# Patient Record
Sex: Female | Born: 1990
Health system: Southern US, Community
[De-identification: ages and names within clinical notes are randomized; demographics above are authoritative.]

## PROBLEM LIST (undated history)

## (undated) ENCOUNTER — Inpatient Hospital Stay (HOSPITAL_COMMUNITY): Payer: Self-pay

## (undated) DIAGNOSIS — Z789 Other specified health status: Secondary | ICD-10-CM

## (undated) DIAGNOSIS — I10 Essential (primary) hypertension: Secondary | ICD-10-CM

## (undated) HISTORY — PX: NO PAST SURGERIES: SHX2092

---

## 2010-05-06 ENCOUNTER — Ambulatory Visit: Payer: Self-pay | Admitting: Diagnostic Radiology

## 2010-05-06 ENCOUNTER — Emergency Department (HOSPITAL_BASED_OUTPATIENT_CLINIC_OR_DEPARTMENT_OTHER): Admission: EM | Admit: 2010-05-06 | Discharge: 2010-05-06 | Payer: Self-pay | Admitting: Emergency Medicine

## 2011-01-02 LAB — PREGNANCY, URINE: Preg Test, Ur: NEGATIVE

## 2013-08-17 ENCOUNTER — Inpatient Hospital Stay (HOSPITAL_COMMUNITY): Payer: Medicaid Other

## 2013-08-17 ENCOUNTER — Encounter (HOSPITAL_COMMUNITY): Payer: Self-pay | Admitting: *Deleted

## 2013-08-17 ENCOUNTER — Inpatient Hospital Stay (HOSPITAL_COMMUNITY)
Admission: AD | Admit: 2013-08-17 | Discharge: 2013-08-17 | Disposition: A | Discharge status: Home / Self Care | Payer: Medicaid Other | Source: Ambulatory Visit | Attending: Obstetrics & Gynecology | Admitting: Obstetrics & Gynecology

## 2013-08-17 DIAGNOSIS — E860 Dehydration: Secondary | ICD-10-CM

## 2013-08-17 DIAGNOSIS — R109 Unspecified abdominal pain: Secondary | ICD-10-CM | POA: Insufficient documentation

## 2013-08-17 DIAGNOSIS — R1084 Generalized abdominal pain: Secondary | ICD-10-CM

## 2013-08-17 DIAGNOSIS — Z3201 Encounter for pregnancy test, result positive: Secondary | ICD-10-CM

## 2013-08-17 DIAGNOSIS — R030 Elevated blood-pressure reading, without diagnosis of hypertension: Secondary | ICD-10-CM | POA: Insufficient documentation

## 2013-08-17 DIAGNOSIS — O211 Hyperemesis gravidarum with metabolic disturbance: Secondary | ICD-10-CM | POA: Insufficient documentation

## 2013-08-17 DIAGNOSIS — O219 Vomiting of pregnancy, unspecified: Secondary | ICD-10-CM

## 2013-08-17 DIAGNOSIS — R112 Nausea with vomiting, unspecified: Secondary | ICD-10-CM

## 2013-08-17 DIAGNOSIS — Z3490 Encounter for supervision of normal pregnancy, unspecified, unspecified trimester: Secondary | ICD-10-CM

## 2013-08-17 DIAGNOSIS — E669 Obesity, unspecified: Secondary | ICD-10-CM | POA: Insufficient documentation

## 2013-08-17 HISTORY — DX: Other specified health status: Z78.9

## 2013-08-17 LAB — CBC
Hemoglobin: 10.9 g/dL — ABNORMAL LOW (ref 12.0–15.0)
MCH: 28.2 pg (ref 26.0–34.0)
MCHC: 33.5 g/dL (ref 30.0–36.0)
RBC: 3.86 MIL/uL — ABNORMAL LOW (ref 3.87–5.11)
WBC: 9.5 10*3/uL (ref 4.0–10.5)

## 2013-08-17 LAB — URINALYSIS, ROUTINE W REFLEX MICROSCOPIC
Bilirubin Urine: NEGATIVE
Glucose, UA: NEGATIVE mg/dL
Hgb urine dipstick: NEGATIVE
Leukocytes, UA: NEGATIVE
Specific Gravity, Urine: >1.03 — ABNORMAL HIGH (ref 1.005–1.030)
pH: 6.5 (ref 5.0–8.0)

## 2013-08-17 LAB — HCG, QUANTITATIVE, PREGNANCY: hCG, Beta Chain, Quant, S: 8942 m[IU]/mL — ABNORMAL HIGH (ref <5)

## 2013-08-17 LAB — WET PREP, GENITAL
WBC, Wet Prep HPF POC: NONE SEEN
Yeast Wet Prep HPF POC: NONE SEEN

## 2013-08-17 LAB — ABO/RH: ABO/RH(D): O POS

## 2013-08-17 LAB — POCT PREGNANCY, URINE: Preg Test, Ur: POSITIVE — AB

## 2013-08-17 MED ORDER — PROMETHAZINE HCL 12.5 MG PO TABS: 12.5000 mg | ORAL_TABLET | Freq: Four times a day (QID) | ORAL | Status: DC | PRN

## 2013-08-17 MED ORDER — PRENATAL PLUS 27-1 MG PO TABS: 1.0000 | ORAL_TABLET | Freq: Every day | ORAL | Status: DC

## 2013-08-17 NOTE — MAU Note (Signed)
Past week having headaches, abd. Cramps, no cycle this month but I normally have irreg. Cycles. LMp 9/10. Denies bleeding or d/c

## 2013-08-17 NOTE — MAU Provider Note (Signed)
Chief Complaint: Abdominal Pain, Nausea, Headache and Possible Pregnancy   First Provider Initiated Contact with Patient 08/17/13 2208     SUBJECTIVE HPI: Christina Larson is a 22 y.o. G1P0 at [redacted]w[redacted]d by LMP who presents with one-week history of lower abdominal cramping, nausea with occasional vomiting and intermittent headaches. She denies nausea or headache at present, but is having some cramping suprapubic discomfort. Did not eat for past 6 hrs, but hungry. No vaginal bleeding. She was unaware of pregnancy and states menstrual interval is usually 2 months.  ROS: Pertinent items in HPI  Past Medical History  Diagnosis Date  . Medical history non-contributory   Denies hx. BP elevation   OB History  Gravida Para Term Preterm AB SAB TAB Ectopic Multiple Living  1             # Outcome Date GA Lbr Len/2nd Weight Sex Delivery Anes PTL Lv  1 CUR              Past Surgical History  Procedure Laterality Date  . No past surgeries     History   Social History  . Marital Status: Single    Spouse Name: N/A    Number of Children: N/A  . Years of Education: N/A   Occupational History  . Not on file.   Social History Main Topics  . Smoking status: Light Tobacco Smoker  . Smokeless tobacco: Not on file  . Alcohol Use: No  . Drug Use: No  . Sexual Activity: Yes    Birth Control/ Protection: None   Other Topics Concern  . Not on file   Social History Narrative  . No narrative on file   No current facility-administered medications on file prior to encounter.   No current outpatient prescriptions on file prior to encounter.   No Known Allergies    OBJECTIVE Blood pressure 144/88, pulse 91, temperature 98.5 F (36.9 C), resp. rate 20, height 5\' 4"  (1.626 m), weight 283 lb 3.2 oz (128.459 kg), last menstrual period 06/27/2013. GENERAL: Obese female in no acute distress.  HEENT: Normocephalic HEART: normal rate RESP: normal effort ABDOMEN: Soft, non-tender EXTREMITIES:  Nontender, no edema NEURO: Alert and oriented SPECULUM EXAM: NEFG, physiologic discharge, no blood noted, cervix clean BIMANUAL: cervix L/C; uterus unable to size due to body habitus, no adnexal tenderness or masses  LAB RESULTS Results for orders placed during the hospital encounter of 08/17/13 (from the past 24 hour(s))  URINALYSIS, ROUTINE W REFLEX MICROSCOPIC     Status: Abnormal   Collection Time    08/17/13  9:37 PM      Result Value Range   Color, Urine YELLOW  YELLOW   APPearance CLEAR  CLEAR   Specific Gravity, Urine >1.030 (*) 1.005 - 1.030   pH 6.5  5.0 - 8.0   Glucose, UA NEGATIVE  NEGATIVE mg/dL   Hgb urine dipstick NEGATIVE  NEGATIVE   Bilirubin Urine NEGATIVE  NEGATIVE   Ketones, ur 15 (*) NEGATIVE mg/dL   Protein, ur NEGATIVE  NEGATIVE mg/dL   Urobilinogen, UA 0.2  0.0 - 1.0 mg/dL   Nitrite NEGATIVE  NEGATIVE   Leukocytes, UA NEGATIVE  NEGATIVE  POCT PREGNANCY, URINE     Status: Abnormal   Collection Time    08/17/13  9:37 PM      Result Value Range   Preg Test, Ur POSITIVE (*) NEGATIVE  WET PREP, GENITAL     Status: None   Collection Time    08/17/13  10:22 PM      Result Value Range   Yeast Wet Prep HPF POC NONE SEEN  NONE SEEN   Trich, Wet Prep NONE SEEN  NONE SEEN   Clue Cells Wet Prep HPF POC NONE SEEN  NONE SEEN   WBC, Wet Prep HPF POC NONE SEEN  NONE SEEN  CBC     Status: Abnormal   Collection Time    08/17/13 10:29 PM      Result Value Range   WBC 9.5  4.0 - 10.5 K/uL   RBC 3.86 (*) 3.87 - 5.11 MIL/uL   Hemoglobin 10.9 (*) 12.0 - 15.0 g/dL   HCT 08.6 (*) 57.8 - 46.9 %   MCV 84.2  78.0 - 100.0 fL   MCH 28.2  26.0 - 34.0 pg   MCHC 33.5  30.0 - 36.0 g/dL   RDW 62.9  52.8 - 41.3 %   Platelets 450 (*) 150 - 400 K/uL  ABO/RH     Status: None   Collection Time    08/17/13 10:29 PM      Result Value Range   ABO/RH(D) O POS    HCG, QUANTITATIVE, PREGNANCY     Status: Abnormal   Collection Time    08/17/13 10:29 PM      Result Value Range   hCG,  Beta Chain, Quant, S 8942 (*) <5 mIU/mL    IMAGING Pelvic US: GS and YS visualized. Gestational sac consistent with 6 weeks 1 day. No embryo visualized. Small SCH. No adnexal mass.  MAU COURSE GC/CT done Fluids po pushed. Tolerating crackers.  ASSESSMENT 1. Pregnancy   2. Elevated BP   3. Mild dehydration   4. Nausea/vomiting in pregnancy   5. Obesity, unspecified     PLAN Discharge home with pregnancy precautions.     Medication List         prenatal vitamin w/FE, FA 27-1 MG Tabs tablet  Take 1 tablet by mouth daily.     promethazine 12.5 MG tablet  Commonly known as:  PHENERGAN  Take 1 tablet (12.5 mg total) by mouth every 6 (six) hours as needed for nausea.       Follow-up Information   Follow up with [facility] [place]. (See list of prenatal care providers)    Contact information:   [address] [place] [address] [phone]        Danae Orleans, CNM 08/17/2013  10:20 PM

## 2013-08-27 ENCOUNTER — Ambulatory Visit (HOSPITAL_COMMUNITY)
Admission: RE | Admit: 2013-08-27 | Discharge: 2013-08-27 | Disposition: A | Discharge status: Home / Self Care | Payer: Medicaid Other | Source: Ambulatory Visit | Attending: Obstetrics and Gynecology | Admitting: Obstetrics and Gynecology

## 2013-08-27 DIAGNOSIS — O219 Vomiting of pregnancy, unspecified: Secondary | ICD-10-CM

## 2013-08-27 DIAGNOSIS — Z3490 Encounter for supervision of normal pregnancy, unspecified, unspecified trimester: Secondary | ICD-10-CM

## 2013-08-27 DIAGNOSIS — Z3689 Encounter for other specified antenatal screening: Secondary | ICD-10-CM | POA: Insufficient documentation

## 2013-08-27 DIAGNOSIS — O3680X0 Pregnancy with inconclusive fetal viability, not applicable or unspecified: Secondary | ICD-10-CM | POA: Insufficient documentation

## 2013-08-27 NOTE — MAU Provider Note (Signed)
Attestation of Attending Supervision of Advanced Practitioner (CNM/NP): Evaluation and management procedures were performed by the Advanced Practitioner under my supervision and collaboration.  I have reviewed the Advanced Practitioner's note and chart, and I agree with the management and plan.  HARRAWAY-SMITH, Myracle Febres 1:14 PM      

## 2013-09-03 ENCOUNTER — Inpatient Hospital Stay (HOSPITAL_COMMUNITY): Payer: Medicaid Other

## 2013-09-03 ENCOUNTER — Inpatient Hospital Stay (HOSPITAL_COMMUNITY)
Admission: AD | Admit: 2013-09-03 | Discharge: 2013-09-03 | Disposition: A | Discharge status: Home / Self Care | Payer: Medicaid Other | Source: Ambulatory Visit | Attending: Family Medicine | Admitting: Family Medicine

## 2013-09-03 ENCOUNTER — Encounter (HOSPITAL_COMMUNITY): Payer: Self-pay | Admitting: *Deleted

## 2013-09-03 DIAGNOSIS — O209 Hemorrhage in early pregnancy, unspecified: Secondary | ICD-10-CM | POA: Insufficient documentation

## 2013-09-03 DIAGNOSIS — R109 Unspecified abdominal pain: Secondary | ICD-10-CM | POA: Insufficient documentation

## 2013-09-03 LAB — URINALYSIS, ROUTINE W REFLEX MICROSCOPIC
Bilirubin Urine: NEGATIVE
Leukocytes, UA: NEGATIVE
Protein, ur: NEGATIVE mg/dL
Specific Gravity, Urine: 1.02 (ref 1.005–1.030)
pH: 8.5 — ABNORMAL HIGH (ref 5.0–8.0)

## 2013-09-03 NOTE — MAU Note (Signed)
Pt presents with complaints of bright red vaginal spotting since last night. Says that she has some lower abdominal cramping also.

## 2013-09-03 NOTE — MAU Provider Note (Signed)
Chief Complaint  Patient presents with  . Vaginal Bleeding    Subjective Christina Larson 22 y.o.  G1P0 at [redacted]w[redacted]d by LMP presents with onset last night of first episode of small amount pink vaginal bleeding. Did not see any blood today. Has menstrual-like crampy lower abdominal pain. Last intercourse weeks ago. Denies abnormal vaginal discharge or irritation. No dysuria or hematuria.  Seen here 08/17/13 for cramps and had US showing IUGS and YS; GC/CT and WP neg. Blood type: O pos Plans PNC at [facility] when [facility] card comes  Problem list, past medical history, Ob/Gyn history, surgical history, family history and social history reviewed and updated as appropriate. Pertinent Medical History: none Pertinent Ob/Gyn History: none Pertinent Surgical History: none Prescriptions prior to admission  Medication Sig Dispense Refill  . prenatal vitamin w/FE, FA (PRENATAL 1 + 1) 27-1 MG TABS tablet Take 1 tablet by mouth daily.  30 each  0  . promethazine (PHENERGAN) 12.5 MG tablet Take 1 tablet (12.5 mg total) by mouth every 6 (six) hours as needed for nausea.  30 tablet  0    No Known Allergies   Objective   Filed Vitals:   09/03/13 1508  BP: 144/78  Pulse: 94  Temp: 98.5 F (36.9 C)  Resp: 18     Physical Exam General: WN/WD in NAD  Abdom: soft, NT External genitalia: normal; BUS neg  SSE: no blood; cervix with no lesions, appears closed Bimanual: Cervix closed, long; uterus NT, ULNS ; adnexa nontender   Lab Results Results for orders placed during the hospital encounter of 09/03/13 (from the past 24 hour(s))  URINALYSIS, ROUTINE W REFLEX MICROSCOPIC     Status: Abnormal   Collection Time    09/03/13  2:50 PM      Result Value Range   Color, Urine YELLOW  YELLOW   APPearance CLOUDY (*) CLEAR   Specific Gravity, Urine 1.020  1.005 - 1.030   pH 8.5 (*) 5.0 - 8.0   Glucose, UA NEGATIVE  NEGATIVE mg/dL   Hgb urine dipstick NEGATIVE  NEGATIVE   Bilirubin Urine NEGATIVE  NEGATIVE    Ketones, ur NEGATIVE  NEGATIVE mg/dL   Protein, ur NEGATIVE  NEGATIVE mg/dL   Urobilinogen, UA 0.2  0.0 - 1.0 mg/dL   Nitrite NEGATIVE  NEGATIVE   Leukocytes, UA NEGATIVE  NEGATIVE    Ultrasound No results found. Bedside US: difficult due to body habitus Assessment 1. Bleeding in early pregnancy   G1 at [redacted]w[redacted]d IUP confirmed at earlier visit   Plan    Discharge home with reassurance Outpt scan for viability See AVS for pt education   Medication List         prenatal multivitamin Tabs tablet  Take 1 tablet by mouth daily at 12 noon.         Follow-up Information   Please follow up. (F/U with [facility])        Sarafina Puthoff 09/03/2013 3:20 PM

## 2013-09-03 NOTE — MAU Provider Note (Signed)
Chart reviewed and agree with management and plan.  

## 2013-09-27 ENCOUNTER — Inpatient Hospital Stay (HOSPITAL_COMMUNITY)
Admission: AD | Admit: 2013-09-27 | Discharge: 2013-09-27 | Disposition: A | Discharge status: Home / Self Care | Payer: BC Managed Care – PPO | Source: Ambulatory Visit | Attending: Obstetrics & Gynecology | Admitting: Obstetrics & Gynecology

## 2013-09-27 ENCOUNTER — Encounter (HOSPITAL_COMMUNITY): Payer: Self-pay | Admitting: *Deleted

## 2013-09-27 DIAGNOSIS — O99891 Other specified diseases and conditions complicating pregnancy: Secondary | ICD-10-CM | POA: Insufficient documentation

## 2013-09-27 DIAGNOSIS — IMO0002 Reserved for concepts with insufficient information to code with codable children: Secondary | ICD-10-CM | POA: Insufficient documentation

## 2013-09-27 DIAGNOSIS — Y9229 Other specified public building as the place of occurrence of the external cause: Secondary | ICD-10-CM | POA: Insufficient documentation

## 2013-09-27 DIAGNOSIS — S3991XA Unspecified injury of abdomen, initial encounter: Secondary | ICD-10-CM

## 2013-09-27 DIAGNOSIS — R1032 Left lower quadrant pain: Secondary | ICD-10-CM | POA: Insufficient documentation

## 2013-09-27 DIAGNOSIS — S3981XA Other specified injuries of abdomen, initial encounter: Secondary | ICD-10-CM

## 2013-09-27 LAB — URINALYSIS, ROUTINE W REFLEX MICROSCOPIC
Bilirubin Urine: NEGATIVE
Hgb urine dipstick: NEGATIVE
Protein, ur: NEGATIVE mg/dL
Urobilinogen, UA: 0.2 mg/dL (ref 0.0–1.0)

## 2013-09-27 NOTE — MAU Provider Note (Signed)
History     CSN: [id number]  Arrival date and time: 09/27/13 2138   First Provider Initiated Contact with Patient 09/27/13 2210      Chief Complaint  Patient presents with  . Abdominal Pain   HPI This is a 22 y.o. female at [redacted]w[redacted]d who presents after getting hit in the  Left Lower quadrant with a door handle today.  States LLQ is tender, worried about baby. No leaking or bleeding  RN Note: Patient presents to MAU with c/o sharp abdominal cramping after having gotten hit in the abdomen by a door at school today.      OB History   Grav Para Term Preterm Abortions TAB SAB Ect Mult Living   1               Past Medical History  Diagnosis Date  . Medical history non-contributory     Past Surgical History  Procedure Laterality Date  . No past surgeries      Family History  Problem Relation Age of Onset  . Alcohol abuse Neg Hx   . Arthritis Neg Hx   . Birth defects Neg Hx   . Asthma Neg Hx   . Cancer Neg Hx   . COPD Neg Hx   . Depression Neg Hx   . Diabetes Neg Hx   . Drug abuse Neg Hx   . Early death Neg Hx   . Hearing loss Neg Hx   . Heart disease Neg Hx   . Hyperlipidemia Neg Hx   . Hypertension Neg Hx   . Kidney disease Neg Hx   . Learning disabilities Neg Hx   . Mental illness Neg Hx   . Mental retardation Neg Hx   . Miscarriages / Stillbirths Neg Hx   . Stroke Neg Hx   . Vision loss Neg Hx   . Varicose Veins Neg Hx     History  Substance Use Topics  . Smoking status: Light Tobacco Smoker  . Smokeless tobacco: Not on file  . Alcohol Use: No    Allergies: No Known Allergies  Prescriptions prior to admission  Medication Sig Dispense Refill  . Prenatal Vit-Fe Fumarate-FA (PRENATAL MULTIVITAMIN) TABS tablet Take 1 tablet by mouth daily at 12 noon.        Review of Systems  Constitutional: Negative for fever, chills and malaise/fatigue.  Gastrointestinal: Positive for nausea and abdominal pain (tender over LLQ superficially). Negative for  vomiting, diarrhea and constipation.  Genitourinary: Negative for dysuria.  Neurological: Negative for dizziness, focal weakness, weakness and headaches.   Physical Exam   Blood pressure 129/76, pulse 87, temperature 98.4 F (36.9 C), temperature source Oral, resp. rate 18, height 5\' 4"  (1.626 m), weight 289 lb 6.4 oz (131.271 kg), last menstrual period 06/27/2013, SpO2 100.00%.  Physical Exam  Constitutional: She is oriented to person, place, and time. She appears well-developed and well-nourished. No distress.  Cardiovascular: Normal rate.   Respiratory: Effort normal.  GI: Soft. She exhibits no distension and no mass. There is tenderness (superficial tenderness over LLQ, no suprapubic tenderness). There is no rebound and no guarding.  Genitourinary: No vaginal discharge found.  Fetal heart tones audible 150 over suprapubic area   Musculoskeletal: Normal range of motion.  Neurological: She is alert and oriented to person, place, and time.  Skin: Skin is warm and dry.  Psychiatric: She has a normal mood and affect.    MAU Course  Procedures  Assessment and Plan  A:  SIUP  at [redacted]w[redacted]d , confirmed dates by 7 wk [place]      Blunt abdominal trauma by door      + fetal heart tones   P:  Discussed protection of baby by uterus and pelvis at this stage      Reassured by [facility]      Followup with prenatal care at [facility] 09/27/2013, 10:32 PM

## 2013-09-27 NOTE — MAU Note (Signed)
Patient presents to MAU with c/o sharp abdominal cramping after having gotten hit in the abdomen by a door at school today.

## 2013-10-05 NOTE — MAU Provider Note (Signed)
Attestation of Attending Supervision of Advanced Practitioner (CNM/NP): Evaluation and management procedures were performed by the Advanced Practitioner under my supervision and collaboration. I have reviewed the Advanced Practitioner's note and chart, and I agree with the management and plan.  Kadence Mikkelson H. 10:28 AM   

## 2013-12-31 ENCOUNTER — Encounter (HOSPITAL_COMMUNITY): Payer: Self-pay | Admitting: *Deleted

## 2013-12-31 ENCOUNTER — Inpatient Hospital Stay (HOSPITAL_COMMUNITY)
Admission: AD | Admit: 2013-12-31 | Discharge: 2013-12-31 | Disposition: A | Discharge status: Home / Self Care | Payer: BC Managed Care – PPO | Source: Ambulatory Visit | Attending: Obstetrics & Gynecology | Admitting: Obstetrics & Gynecology

## 2013-12-31 DIAGNOSIS — O99330 Smoking (tobacco) complicating pregnancy, unspecified trimester: Secondary | ICD-10-CM | POA: Insufficient documentation

## 2013-12-31 DIAGNOSIS — O212 Late vomiting of pregnancy: Secondary | ICD-10-CM | POA: Insufficient documentation

## 2013-12-31 DIAGNOSIS — R109 Unspecified abdominal pain: Secondary | ICD-10-CM | POA: Insufficient documentation

## 2013-12-31 DIAGNOSIS — O219 Vomiting of pregnancy, unspecified: Secondary | ICD-10-CM

## 2013-12-31 LAB — URINALYSIS, ROUTINE W REFLEX MICROSCOPIC
Bilirubin Urine: NEGATIVE
Glucose, UA: NEGATIVE mg/dL
HGB URINE DIPSTICK: NEGATIVE
KETONES UR: 15 mg/dL — AB
LEUKOCYTES UA: NEGATIVE
Nitrite: NEGATIVE
PH: 7 (ref 5.0–8.0)
PROTEIN: NEGATIVE mg/dL
SPECIFIC GRAVITY, URINE: 1.015 (ref 1.005–1.030)
UROBILINOGEN UA: 0.2 mg/dL (ref 0.0–1.0)

## 2013-12-31 MED ORDER — POLYETHYLENE GLYCOL 3350 17 GM/SCOOP PO POWD: 17.0000 g | Freq: Every day | ORAL | Status: DC

## 2013-12-31 MED ORDER — PROMETHAZINE HCL 25 MG/ML IJ SOLN
25.0000 mg | Freq: Once | INTRAVENOUS | Status: AC
  Administered 2013-12-31: 25 mg via INTRAVENOUS
  Filled 2013-12-31: qty 1

## 2013-12-31 MED ORDER — ONDANSETRON 4 MG PO TBDP
4.0000 mg | ORAL_TABLET | Freq: Once | ORAL | Status: AC
  Administered 2013-12-31: 4 mg via ORAL
  Filled 2013-12-31: qty 1

## 2013-12-31 MED ORDER — PROMETHAZINE HCL 12.5 MG PO TABS: 12.5000 mg | ORAL_TABLET | Freq: Four times a day (QID) | ORAL | Status: DC | PRN

## 2013-12-31 NOTE — MAU Note (Signed)
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 States she has had nausea and vomiting over the past weekend. States she has had 2 loose stools today, not diarrhea. Has been having lower abdominal cramping and fatigue. Reports feeling movement but not as much as usual.

## 2013-12-31 NOTE — MAU Provider Note (Signed)
Chief Complaint:  Nausea, Emesis, Fatigue and Abdominal Pain   First Provider Initiated Contact with Patient 12/31/13 1943      HPI: Christina Larson is a 23 y.o. G1P0 at [redacted]w[redacted]d who presents to maternity admissions reporting nausea, vomiting and loose stools for 4 days. Nausea now associated with fatigue, weakness and dizziness. Denies contractions but feels lower abdominal cramping. Denies leakage of fluid or vaginal bleeding. Fetal movement is less than usual.   Pregnancy Course: PNC at [place] in [facility] complicated by obesity. Student in [place] and left school to come here.   Past Medical History: Past Medical History  Diagnosis Date  . Medical history non-contributory     Past obstetric history: OB History  Gravida Para Term Preterm AB SAB TAB Ectopic Multiple Living  1             # Outcome Date GA Lbr Len/2nd Weight Sex Delivery Anes PTL Lv  1 CUR               Past Surgical History: Past Surgical History  Procedure Laterality Date  . No past surgeries       Family History: [place]   Social History: History  Substance Use Topics  . Smoking status: Light Tobacco Smoker  . Smokeless tobacco: Not on file  . Alcohol Use: No    Allergies: No Known Allergies  Meds:  Prescriptions prior to admission  Medication Sig Dispense Refill  . acetaminophen (TYLENOL) 500 MG tablet Take 1,000 mg by mouth every 6 (six) hours as needed for headache.      . Prenatal Vit-Fe Fumarate-FA (PRENATAL MULTIVITAMIN) TABS tablet Take 1 tablet by mouth daily at 12 noon.        ROS: Pertinent findings in history of present illness.  Physical Exam  Blood pressure 132/84, pulse 92, temperature 98 F (36.7 C), temperature source Oral, resp. rate 18, height 5\' 5"  (1.651 m), weight 137.984 kg (304 lb 3.2 oz), last menstrual period 06/27/2013, SpO2 99.00%. GENERAL: Well-developed, well-nourished female in no acute distress.  HEENT: normocephalic HEART: normal rate RESP: normal  effort ABDOMEN: Soft, non-tender, gravid appropriate for gestational age EXTREMITIES: Nontender, no edema NEURO: alert and oriented  Dilation: Closed Effacement (%): Thick Cervical Position: Posterior Exam by:: Dr. Leslie Andrea Dilation: Closed Effacement (%): Thick Cervical Position: Posterior Exam by:: Dr. Leslie Andrea  FHT:  Baseline 145, moderate variability, accelerations present, no decelerations Contractions: none   Labs: Results for orders placed during the hospital encounter of 12/31/13 (from the past 24 hour(s))  URINALYSIS, ROUTINE W REFLEX MICROSCOPIC     Status: Abnormal   Collection Time    12/31/13  6:55 PM      Result Value Ref Range   Color, Urine YELLOW  YELLOW   APPearance CLEAR  CLEAR   Specific Gravity, Urine 1.015  1.005 - 1.030   pH 7.0  5.0 - 8.0   Glucose, UA NEGATIVE  NEGATIVE mg/dL   Hgb urine dipstick NEGATIVE  NEGATIVE   Bilirubin Urine NEGATIVE  NEGATIVE   Ketones, ur 15 (*) NEGATIVE mg/dL   Protein, ur NEGATIVE  NEGATIVE mg/dL   Urobilinogen, UA 0.2  0.0 - 1.0 mg/dL   Nitrite NEGATIVE  NEGATIVE   Leukocytes, UA NEGATIVE  NEGATIVE    Imaging:  No results found. MAU Course: Zofran 4 mg ODT given> still nauseated IV LR with Phenergan 25 mg Dr. Cranford Mon assumed care at [place], MD 12/31/2013 10:02 PM   Assessment: 1. Nausea  and vomiting in pregnancy     Plan: Discharge home Phenergan PRN Follow up with your provider in [place]. Return for PTL precautions or other concerning symptoms Labor precautions and fetal kick counts     Follow-up Information   Please follow up. (As Previously Scheduled with your provider)        Medication List         acetaminophen 500 MG tablet  Commonly known as:  TYLENOL  Take 1,000 mg by mouth every 6 (six) hours as needed for headache.     polyethylene glycol powder powder  Commonly known as:  GLYCOLAX/MIRALAX  Take 17 g by mouth daily.  Start taking on:  01/01/2014     prenatal multivitamin  Tabs tablet  Take 1 tablet by mouth daily at 12 noon.     promethazine 12.5 MG tablet  Commonly known as:  PHENERGAN  Take 1 tablet (12.5 mg total) by mouth every 6 (six) hours as needed for nausea or vomiting.       Dierdre Freida Busman, CNM  I assumed care of this patient from [place] at 42. PT is doing well no that she rec'd IV phenergan and fluid bolus. Pt feeling better but persistent cramping. Unchanged cervix, likely related to constipation. Will give miralax and have pt follow up with primary OB in [place]. Agree with above documentation  Fredrik Rigger, MD [facility] Fellow

## 2013-12-31 NOTE — Discharge Instructions (Signed)

## 2014-01-29 ENCOUNTER — Encounter (HOSPITAL_COMMUNITY): Payer: Self-pay

## 2014-01-29 ENCOUNTER — Inpatient Hospital Stay (HOSPITAL_COMMUNITY)
Admission: AD | Admit: 2014-01-29 | Discharge: 2014-01-29 | Disposition: A | Discharge status: Home / Self Care | Payer: BC Managed Care – PPO | Source: Ambulatory Visit | Attending: Obstetrics & Gynecology | Admitting: Obstetrics & Gynecology

## 2014-01-29 DIAGNOSIS — O4690 Antepartum hemorrhage, unspecified, unspecified trimester: Secondary | ICD-10-CM | POA: Insufficient documentation

## 2014-01-29 DIAGNOSIS — O99330 Smoking (tobacco) complicating pregnancy, unspecified trimester: Secondary | ICD-10-CM | POA: Insufficient documentation

## 2014-01-29 DIAGNOSIS — R109 Unspecified abdominal pain: Secondary | ICD-10-CM | POA: Insufficient documentation

## 2014-01-29 LAB — URINE MICROSCOPIC-ADD ON

## 2014-01-29 LAB — URINALYSIS, ROUTINE W REFLEX MICROSCOPIC
Bilirubin Urine: NEGATIVE
Glucose, UA: NEGATIVE mg/dL
Ketones, ur: 15 mg/dL — AB
LEUKOCYTES UA: NEGATIVE
NITRITE: NEGATIVE
PH: 7 (ref 5.0–8.0)
Protein, ur: NEGATIVE mg/dL
SPECIFIC GRAVITY, URINE: 1.02 (ref 1.005–1.030)
Urobilinogen, UA: 0.2 mg/dL (ref 0.0–1.0)

## 2014-01-29 MED ORDER — CEPHALEXIN 750 MG PO CAPS: 750.0000 mg | ORAL_CAPSULE | Freq: Four times a day (QID) | ORAL | Status: DC

## 2014-01-29 NOTE — Discharge Instructions (Signed)
No bleeding was noted on your exam. Your urine did have some blood in it.  We will treat you empirically, while awaiting the culture results.  Continue to follow up closely with your OB-GYN. Abstain from sex for the next 72 hours.  Please contact your OB-GYN or return if your develop further bleeding, contractions, loss of fluid, or decreased fetal movement.

## 2014-01-29 NOTE — MAU Provider Note (Signed)
  History     CSN: [id number]  Arrival date and time: 01/29/14 1720   None     Chief Complaint  Patient presents with  . Vaginal Bleeding   HPI 23 year old female G1P0 at [redacted]w[redacted]d who presents with complaints of vaginal bleeding and abdominal cramping.  Patient reports that she has had intermittent lower abdominal pain today.  She has also had associated nausea and vomiting.  Pain is mild-moderate and described as "cramping".    Earlier this afternoon at approximately 4PM, she used the restroom and noticed bright red blood with wiping.  No blood noted in the toilet. No associated dysuria/urinary urgency/frequency.  No LOF or contractions.  + Fetal movement.  Past Medical History  Diagnosis Date  . Medical history non-contributory     Past Surgical History  Procedure Laterality Date  . No past surgeries      Family History  Problem Relation Age of Onset  . Alcohol abuse Neg Hx   . Arthritis Neg Hx   . Birth defects Neg Hx   . Asthma Neg Hx   . Cancer Neg Hx   . COPD Neg Hx   . Depression Neg Hx   . Diabetes Neg Hx   . Drug abuse Neg Hx   . Early death Neg Hx   . Hearing loss Neg Hx   . Heart disease Neg Hx   . Hyperlipidemia Neg Hx   . Hypertension Neg Hx   . Kidney disease Neg Hx   . Learning disabilities Neg Hx   . Mental illness Neg Hx   . Mental retardation Neg Hx   . Miscarriages / Stillbirths Neg Hx   . Stroke Neg Hx   . Vision loss Neg Hx   . Varicose Veins Neg Hx     History  Substance Use Topics  . Smoking status: Light Tobacco Smoker  . Smokeless tobacco: Not on file  . Alcohol Use: No    Allergies: No Known Allergies  Prescriptions prior to admission  Medication Sig Dispense Refill  . acetaminophen (TYLENOL) 500 MG tablet Take 1,000 mg by mouth every 6 (six) hours as needed for headache.      . Prenatal Vit-Fe Fumarate-FA (PRENATAL MULTIVITAMIN) TABS tablet Take 1 tablet by mouth daily at 12 noon.        ROS Per HPI Physical Exam    Blood pressure 134/84, pulse 107, temperature 99 F (37.2 C), temperature source Oral, resp. rate 20, height 5\' 5"  (1.651 m), weight 139.073 kg (306 lb 9.6 oz), last menstrual period 06/27/2013, SpO2 99.00%.  Physical Exam Gen: well appearing female in NAD.  Abd: gravid, obese; otherwise soft, nontender to palpation Ext: Trace LE edema. Neuro: No focal deficits.  Pelvic Exam:        External: normal female genitalia without lesions or masses        Vagina: normal. No bleeding noted. Dilation: Closed Effacement (%): Thick Cervical Position: Posterior Station: -3 Exam by:: Diania Co, DO  FHR: baseline 130, mod variability, 15x15 accels, no decels Toco: None noted  MAU Course  Procedures  MDM  Assessment and Plan  23 year old female G1P0 at [redacted]w[redacted]d who presents with complaints of vaginal bleeding and abdominal cramping. - No bleeding on exam; Cervix closed - Reactive NST without contractions on Toco - Patient reassured - Case discussed with [place]. - Stable for discharge home.  Coral Spikes 01/29/2014, 6:53 PM

## 2014-01-29 NOTE — MAU Note (Signed)
Patient states she went to the bathroom and had bright red bleeding on the tissue with wiping. Having lower abdominal cramping since this am. Has nausea and vomiting x 1 this am. Reports fetal movement but states not as much as usual.

## 2014-01-30 LAB — URINE CULTURE
COLONY COUNT: NO GROWTH
Culture: NO GROWTH

## 2014-01-30 NOTE — MAU Provider Note (Signed)
I examined pt and agree with documentation above and resident plan of care. Benn Tarver N Muhammad, CNM  

## 2014-06-22 ENCOUNTER — Encounter (HOSPITAL_COMMUNITY): Payer: Self-pay | Admitting: *Deleted

## 2014-08-19 ENCOUNTER — Encounter (HOSPITAL_COMMUNITY): Payer: Self-pay | Admitting: *Deleted

## 2016-01-28 ENCOUNTER — Encounter (HOSPITAL_BASED_OUTPATIENT_CLINIC_OR_DEPARTMENT_OTHER): Payer: Self-pay | Admitting: Emergency Medicine

## 2016-01-28 ENCOUNTER — Emergency Department (HOSPITAL_BASED_OUTPATIENT_CLINIC_OR_DEPARTMENT_OTHER)
Admission: EM | Admit: 2016-01-28 | Discharge: 2016-01-28 | Disposition: A | Discharge status: Home / Self Care | Payer: BLUE CROSS/BLUE SHIELD | Attending: Emergency Medicine | Admitting: Emergency Medicine

## 2016-01-28 DIAGNOSIS — Z79899 Other long term (current) drug therapy: Secondary | ICD-10-CM | POA: Diagnosis not present

## 2016-01-28 DIAGNOSIS — F17200 Nicotine dependence, unspecified, uncomplicated: Secondary | ICD-10-CM | POA: Insufficient documentation

## 2016-01-28 DIAGNOSIS — Z792 Long term (current) use of antibiotics: Secondary | ICD-10-CM | POA: Diagnosis not present

## 2016-01-28 DIAGNOSIS — N3090 Cystitis, unspecified without hematuria: Secondary | ICD-10-CM | POA: Diagnosis not present

## 2016-01-28 DIAGNOSIS — Z3202 Encounter for pregnancy test, result negative: Secondary | ICD-10-CM | POA: Insufficient documentation

## 2016-01-28 DIAGNOSIS — R350 Frequency of micturition: Secondary | ICD-10-CM | POA: Diagnosis present

## 2016-01-28 LAB — URINALYSIS, ROUTINE W REFLEX MICROSCOPIC
Bilirubin Urine: NEGATIVE
GLUCOSE, UA: NEGATIVE mg/dL
Ketones, ur: 15 mg/dL — AB
Nitrite: POSITIVE — AB
PH: 6.5 (ref 5.0–8.0)
Protein, ur: NEGATIVE mg/dL
Specific Gravity, Urine: 1.007 (ref 1.005–1.030)

## 2016-01-28 LAB — PREGNANCY, URINE: PREG TEST UR: NEGATIVE

## 2016-01-28 LAB — URINE MICROSCOPIC-ADD ON

## 2016-01-28 MED ORDER — NITROFURANTOIN MONOHYD MACRO 100 MG PO CAPS
100.0000 mg | ORAL_CAPSULE | Freq: Two times a day (BID) | ORAL | Status: DC
Start: 2016-01-28 — End: 2016-05-08

## 2016-01-28 MED FILL — NITROFURANTOIN MONO-MCR 100: 100 | 5 days supply | Qty: 10 | Fill #0

## 2016-01-28 NOTE — ED Provider Notes (Signed)
CSN: [id number]     Arrival date & time 01/28/16  1411 History   First MD Initiated Contact with Patient 01/28/16 1427     Chief Complaint  Patient presents with  . Urinary Frequency     (Consider location/radiation/quality/duration/timing/severity/associated sxs/prior Treatment) HPI Comments: Patient presents with complaint of increased urinary frequency, pressure over the past 2 weeks. She took Azo at first which helped. She continues to take this but symptoms worsened. She complains of some low back pain but no flank pain, fever, vomiting. No vaginal bleeding or vaginal discharge. Last menstrual period 10/2013 but patient has [place]koff. History of one other UTI, states this feels similar. The onset of this condition was acute. The course is constant. Aggravating factors: none.   Patient is a 25 y.o. female presenting with frequency. The history is provided by the patient.  Urinary Frequency Pertinent negatives include no abdominal pain, chest pain, coughing, fever, headaches, myalgias, nausea, rash, sore throat or vomiting.    Past Medical History  Diagnosis Date  . Medical history non-contributory    Past Surgical History  Procedure Laterality Date  . No past surgeries     Family History  Problem Relation Age of Onset  . Alcohol abuse Neg Hx   . Arthritis Neg Hx   . Birth defects Neg Hx   . Asthma Neg Hx   . Cancer Neg Hx   . COPD Neg Hx   . Depression Neg Hx   . Diabetes Neg Hx   . Drug abuse Neg Hx   . Early death Neg Hx   . Hearing loss Neg Hx   . Heart disease Neg Hx   . Hyperlipidemia Neg Hx   . Hypertension Neg Hx   . Kidney disease Neg Hx   . Learning disabilities Neg Hx   . Mental illness Neg Hx   . Mental retardation Neg Hx   . Miscarriages / Stillbirths Neg Hx   . Stroke Neg Hx   . Vision loss Neg Hx   . Varicose Veins Neg Hx    Social History  Substance Use Topics  . Smoking status: Light Tobacco Smoker  . Smokeless tobacco: None  . Alcohol Use: No    OB History    Gravida Para Term Preterm AB TAB SAB Ectopic Multiple Living   1              Review of Systems  Constitutional: Negative for fever.  HENT: Negative for rhinorrhea and sore throat.   Eyes: Negative for redness.  Respiratory: Negative for cough.   Cardiovascular: Negative for chest pain.  Gastrointestinal: Negative for nausea, vomiting, abdominal pain and diarrhea.  Genitourinary: Positive for urgency and frequency. Negative for dysuria, hematuria, flank pain, vaginal bleeding and vaginal discharge.  Musculoskeletal: Positive for back pain. Negative for myalgias.  Skin: Negative for rash.  Neurological: Negative for headaches.    Allergies  Review of patient's allergies indicates no known allergies.  Home Medications   Prior to Admission medications   Medication Sig Start Date End Date Taking? Authorizing Provider  acetaminophen (TYLENOL) 500 MG tablet Take 1,000 mg by mouth every 6 (six) hours as needed for headache.    Historical Provider, MD  cephALEXin (KEFLEX) 750 MG capsule Take 1 capsule (750 mg total) by mouth 4 (four) times daily. 01/29/14   Coral Spikes, DO  nitrofurantoin, macrocrystal-monohydrate, (MACROBID) 100 MG capsule Take 1 capsule (100 mg total) by mouth 2 (two) times daily. 01/28/16   Carlisle Cater,  PA-C  Prenatal Vit-Fe Fumarate-FA (PRENATAL MULTIVITAMIN) TABS tablet Take 1 tablet by mouth daily at 12 noon.    Historical Provider, MD   BP 144/86 mmHg  Pulse 104  Temp(Src) 98.5 F (36.9 C) (Oral)  Resp 18  Ht 5\' 5"  (1.651 m)  Wt 127.007 kg  BMI 46.59 kg/m2  SpO2 98%  LMP 11/16/2015   Physical Exam  Constitutional: She appears well-developed and well-nourished.  HENT:  Head: Normocephalic and atraumatic.  Eyes: Conjunctivae are normal. Right eye exhibits no discharge. Left eye exhibits no discharge.  Neck: Normal range of motion. Neck supple.  Cardiovascular: Normal rate, regular rhythm and normal heart sounds.   Pulmonary/Chest:  Effort normal and breath sounds normal.  Abdominal: Soft. There is no tenderness. There is no CVA tenderness.  Neurological: She is alert.  Skin: Skin is warm and dry.  Psychiatric: She has a normal mood and affect.  Nursing note and vitals reviewed.   ED Course  Procedures (including critical care time) Labs Review Labs Reviewed  URINALYSIS, ROUTINE W REFLEX MICROSCOPIC (NOT AT [facility]) - Abnormal; Notable for the following:    Color, Urine ORANGE (*)    Hgb urine dipstick TRACE (*)    Ketones, ur 15 (*)    Nitrite POSITIVE (*)    Leukocytes, UA LARGE (*)    All other components within normal limits  URINE MICROSCOPIC-ADD ON - Abnormal; Notable for the following:    Squamous Epithelial / LPF 0-5 (*)    Bacteria, UA FEW (*)    All other components within normal limits  PREGNANCY, URINE   3:38 PM Patient seen and examined. UA consistent with cystitis. Will treat as such.   Vital signs reviewed and are as follows: BP 144/86 mmHg  Pulse 104  Temp(Src) 98.5 F (36.9 C) (Oral)  Resp 18  Ht 5\' 5"  (1.651 m)  Wt 127.007 kg  BMI 46.59 kg/m2  SpO2 98%  LMP 11/16/2015  Patient urged to return with worsening symptoms or other concerns. Patient verbalized understanding and agrees with plan.    MDM   Final diagnoses:  Cystitis   Patient with uncomplicated cystitis. No signs of pyelo. Patient appears well, non-toxic. No pelvic or other abdominal etiology suspected.    Carlisle Cater, PA-C 01/28/16 [place], MD 01/28/16 [phone]

## 2016-01-28 NOTE — Discharge Instructions (Signed)
Please read and follow all provided instructions.  Your diagnoses today include:  1. Cystitis    Tests performed today include:  Urine test - suggests that you have an infection in your bladder  Vital signs. See below for your results today.   Medications prescribed:   None  Home care instructions:  Follow any educational materials contained in this packet.  Follow-up instructions: Please follow-up with your primary care provider in 3 days if symptoms are not resolved for further evaluation of your symptoms.  Return instructions:   Please return to the Emergency Department if you experience worsening symptoms.   Return with fever, worsening pain, persistent vomiting, worsening pain in your back.   Please return if you have any other emergent concerns.  Additional Information:  Your vital signs today were: BP 144/86 mmHg   Pulse 104   Temp(Src) 98.5 F (36.9 C) (Oral)   Resp 18   Ht 5\' 5"  (1.651 m)   Wt 127.007 kg   BMI 46.59 kg/m2   SpO2 98%   LMP 11/16/2015 If your blood pressure (BP) was elevated above 135/85 this visit, please have this repeated by your doctor within one month. --------------

## 2016-01-28 NOTE — ED Notes (Signed)
Pt in c/o urinary frequency and dysuria onset 2 weeks ago. Home tx with Azo, now endorsing low back pain as well.

## 2016-05-08 ENCOUNTER — Emergency Department (HOSPITAL_COMMUNITY)
Admission: EM | Admit: 2016-05-08 | Discharge: 2016-05-08 | Disposition: A | Discharge status: Home / Self Care | Payer: BLUE CROSS/BLUE SHIELD | Attending: Emergency Medicine | Admitting: Emergency Medicine

## 2016-05-08 ENCOUNTER — Emergency Department (HOSPITAL_COMMUNITY): Payer: BLUE CROSS/BLUE SHIELD

## 2016-05-08 ENCOUNTER — Encounter (HOSPITAL_COMMUNITY): Payer: Self-pay | Admitting: Emergency Medicine

## 2016-05-08 DIAGNOSIS — S01112A Laceration without foreign body of left eyelid and periocular area, initial encounter: Secondary | ICD-10-CM | POA: Diagnosis not present

## 2016-05-08 DIAGNOSIS — Y929 Unspecified place or not applicable: Secondary | ICD-10-CM | POA: Diagnosis not present

## 2016-05-08 DIAGNOSIS — S022XXA Fracture of nasal bones, initial encounter for closed fracture: Secondary | ICD-10-CM | POA: Diagnosis not present

## 2016-05-08 DIAGNOSIS — Y999 Unspecified external cause status: Secondary | ICD-10-CM | POA: Diagnosis not present

## 2016-05-08 DIAGNOSIS — F17200 Nicotine dependence, unspecified, uncomplicated: Secondary | ICD-10-CM | POA: Diagnosis not present

## 2016-05-08 DIAGNOSIS — Y939 Activity, unspecified: Secondary | ICD-10-CM | POA: Diagnosis not present

## 2016-05-08 DIAGNOSIS — S62525A Nondisplaced fracture of distal phalanx of left thumb, initial encounter for closed fracture: Secondary | ICD-10-CM | POA: Diagnosis not present

## 2016-05-08 DIAGNOSIS — S0990XA Unspecified injury of head, initial encounter: Secondary | ICD-10-CM | POA: Diagnosis present

## 2016-05-08 DIAGNOSIS — S62502A Fracture of unspecified phalanx of left thumb, initial encounter for closed fracture: Secondary | ICD-10-CM

## 2016-05-08 LAB — I-STAT BETA HCG BLOOD, ED (MC, WL, AP ONLY)
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
## 2016-05-08 NOTE — ED Notes (Signed)
Patient Alert and oriented X4. Stable and ambulatory. Patient verbalized understanding of the discharge instructions.  Patient belongings were taken by the patient.  
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
## 2016-05-08 NOTE — ED Notes (Signed)
Patient transported to CT 

## 2016-11-02 ENCOUNTER — Emergency Department (HOSPITAL_BASED_OUTPATIENT_CLINIC_OR_DEPARTMENT_OTHER)
Admission: EM | Admit: 2016-11-02 | Discharge: 2016-11-02 | Disposition: A | Discharge status: Home / Self Care | Payer: BLUE CROSS/BLUE SHIELD | Attending: Emergency Medicine | Admitting: Emergency Medicine

## 2016-11-02 ENCOUNTER — Encounter (HOSPITAL_BASED_OUTPATIENT_CLINIC_OR_DEPARTMENT_OTHER): Payer: Self-pay | Admitting: *Deleted

## 2016-11-02 ENCOUNTER — Emergency Department (HOSPITAL_BASED_OUTPATIENT_CLINIC_OR_DEPARTMENT_OTHER): Payer: BLUE CROSS/BLUE SHIELD

## 2016-11-02 DIAGNOSIS — F17200 Nicotine dependence, unspecified, uncomplicated: Secondary | ICD-10-CM | POA: Insufficient documentation

## 2016-11-02 DIAGNOSIS — J111 Influenza due to unidentified influenza virus with other respiratory manifestations: Secondary | ICD-10-CM

## 2016-11-02 DIAGNOSIS — J029 Acute pharyngitis, unspecified: Secondary | ICD-10-CM | POA: Diagnosis present

## 2016-11-02 DIAGNOSIS — R69 Illness, unspecified: Secondary | ICD-10-CM

## 2016-11-02 LAB — CBC WITH DIFFERENTIAL/PLATELET
BASOS ABS: 0.1 10*3/uL (ref 0.0–0.1)
Basophils Relative: 0 %
EOS PCT: 0 %
Eosinophils Absolute: 0 10*3/uL (ref 0.0–0.7)
HCT: 38.8 % (ref 36.0–46.0)
HEMOGLOBIN: 12.2 g/dL (ref 12.0–15.0)
LYMPHS ABS: 2 10*3/uL (ref 0.7–4.0)
Lymphocytes Relative: 11 %
MCH: 26.3 pg (ref 26.0–34.0)
MCHC: 31.4 g/dL (ref 30.0–36.0)
MCV: 83.6 fL (ref 78.0–100.0)
Monocytes Absolute: 1.4 10*3/uL — ABNORMAL HIGH (ref 0.1–1.0)
Monocytes Relative: 8 %
NEUTROS ABS: 14.2 10*3/uL — AB (ref 1.7–7.7)
NEUTROS PCT: 81 %
Platelets: 542 10*3/uL — ABNORMAL HIGH (ref 150–400)
RBC: 4.64 MIL/uL (ref 3.87–5.11)
RDW: 14.9 % (ref 11.5–15.5)
WBC: 17.7 10*3/uL — ABNORMAL HIGH (ref 4.0–10.5)

## 2016-11-02 LAB — BASIC METABOLIC PANEL
ANION GAP: 8 (ref 5–15)
BUN: 9 mg/dL (ref 6–20)
CHLORIDE: 104 mmol/L (ref 101–111)
CO2: 24 mmol/L (ref 22–32)
Calcium: 9.2 mg/dL (ref 8.9–10.3)
Creatinine, Ser: 0.78 mg/dL (ref 0.44–1.00)
GFR calc Af Amer: >60 mL/min (ref >60)
Glucose, Bld: 102 mg/dL — ABNORMAL HIGH (ref 65–99)
POTASSIUM: 3.9 mmol/L (ref 3.5–5.1)
Sodium: 136 mmol/L (ref 135–145)

## 2016-11-02 LAB — URINALYSIS, MICROSCOPIC (REFLEX)

## 2016-11-02 LAB — URINALYSIS, ROUTINE W REFLEX MICROSCOPIC
Glucose, UA: NEGATIVE mg/dL
Ketones, ur: 15 mg/dL — AB
LEUKOCYTES UA: NEGATIVE
NITRITE: NEGATIVE
PH: 7 (ref 5.0–8.0)
Protein, ur: 30 mg/dL — AB
Specific Gravity, Urine: 1.04 — ABNORMAL HIGH (ref 1.005–1.030)

## 2016-11-02 LAB — PREGNANCY, URINE: PREG TEST UR: NEGATIVE

## 2016-11-02 LAB — RAPID STREP SCREEN (MED CTR MEBANE ONLY): Streptococcus, Group A Screen (Direct): NEGATIVE

## 2016-11-02 MED ORDER — DEXAMETHASONE 6 MG PO TABS
10.0000 mg | ORAL_TABLET | Freq: Once | ORAL | Status: AC
  Administered 2016-11-02: 10 mg via ORAL
  Filled 2016-11-02: qty 1

## 2016-11-02 MED ORDER — ONDANSETRON HCL 4 MG/2ML IJ SOLN
4.0000 mg | Freq: Once | INTRAMUSCULAR | Status: AC | PRN
  Administered 2016-11-02: 4 mg via INTRAVENOUS
  Filled 2016-11-02: qty 2

## 2016-11-02 MED ORDER — IBUPROFEN 800 MG PO TABS
800.0000 mg | ORAL_TABLET | Freq: Once | ORAL | Status: AC
  Administered 2016-11-02: 800 mg via ORAL
  Filled 2016-11-02: qty 1

## 2016-11-02 NOTE — ED Provider Notes (Signed)
[facility] Provider Note   CSN: [id number] Arrival date & time: 11/02/16  1812     History   Chief Complaint Chief Complaint  Patient presents with  . Fever  . Sore Throat    HPI Christina Larson is a 26 y.o. female.  The history is provided by the patient.  Fever   This is a new problem. The current episode started 2 days ago. The problem occurs daily. The problem has not changed since onset.The maximum temperature noted was 102 to 102.9 F. Associated symptoms include vomiting (once; mucus ), congestion, sore throat, muscle aches and cough. Pertinent negatives include no diarrhea. She has tried acetaminophen for the symptoms. The treatment provided mild relief.   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  Past Medical History:  Diagnosis Date  . Medical history non-contributory     There are no active problems to display for this patient.   Past Surgical History:  Procedure Laterality Date  . NO PAST SURGERIES      OB History    Gravida Para Term Preterm AB Living   1             SAB TAB Ectopic Multiple Live Births                   Home Medications    Prior to Admission medications   Medication Sig Start Date End Date Taking? Authorizing Provider  acetaminophen (TYLENOL) 500 MG tablet Take 2,000-2,500 mg by mouth 2 (two) times daily as needed for mild pain or headache.     Historical Provider, MD  etonogestrel (NEXPLANON) 68 MG IMPL implant 1 each by Subdermal route once. Expires August 2018    Historical Provider, MD    Family History Family History  Problem Relation Age of Onset  . Alcohol abuse Neg Hx   . Arthritis Neg Hx   . Birth defects Neg Hx   . Asthma Neg Hx   . Cancer Neg Hx   . COPD Neg Hx   . Depression Neg Hx   . Diabetes Neg Hx   . Drug abuse Neg Hx   . Early death Neg Hx   . Hearing loss Neg Hx   . Heart disease Neg Hx   . Hyperlipidemia Neg Hx   . Hypertension Neg Hx   . Kidney disease Neg Hx   . Learning  disabilities Neg Hx   . Mental illness Neg Hx   . Mental retardation Neg Hx   . Miscarriages / Stillbirths Neg Hx   . Stroke Neg Hx   . Vision loss Neg Hx   . Varicose Veins Neg Hx     Social History Social History  Substance Use Topics  . Smoking status: Light Tobacco Smoker  . Smokeless tobacco: Never Used  . Alcohol use No     Allergies   Patient has no known allergies.   Review of Systems Review of Systems  Constitutional: Positive for fever.  HENT: Positive for congestion and sore throat.   Respiratory: Positive for cough.   Gastrointestinal: Positive for vomiting (once; mucus ). Negative for diarrhea.  Ten systems are reviewed and are negative for acute change except as noted in the HPI    Physical Exam Updated Vital Signs BP 137/84 (BP Location: Left Arm)   Pulse 108   Temp 101.8 F (38.8 C)   Resp 20   Ht 5\' 4"  (1.626 m)   Wt 280 lb (127 kg)  SpO2 98%   BMI 48.06 kg/m   Physical Exam  Constitutional: She is oriented to person, place, and time. She appears well-developed and well-nourished. No distress.  HENT:  Head: Normocephalic and atraumatic.  Nose: Mucosal edema present.  Mouth/Throat: Posterior oropharyngeal edema and posterior oropharyngeal erythema present. No tonsillar abscesses. Tonsils are 3+ on the right. Tonsils are 3+ on the left. Tonsillar exudate.  Eyes: Conjunctivae and EOM are normal. Pupils are equal, round, and reactive to light. Right eye exhibits no discharge. Left eye exhibits no discharge. No scleral icterus.  Neck: Normal range of motion. Neck supple.  Cardiovascular: Normal rate and regular rhythm.  Exam reveals no gallop and no friction rub.   No murmur heard. Pulmonary/Chest: Effort normal and breath sounds normal. No stridor. No respiratory distress. She has no rales.  Abdominal: Soft. She exhibits no distension. There is no tenderness.  Musculoskeletal: She exhibits no edema or tenderness.  Neurological: She is alert and  oriented to person, place, and time.  Skin: Skin is warm and dry. No rash noted. She is not diaphoretic. No erythema.  Psychiatric: She has a normal mood and affect.  Vitals reviewed.    ED Treatments / Results  Labs (all labs ordered are listed, but only abnormal results are displayed) Labs Reviewed  URINALYSIS, ROUTINE W REFLEX MICROSCOPIC - Abnormal; Notable for the following:       Result Value   Color, Urine AMBER (*)    APPearance CLOUDY (*)    Specific Gravity, Urine 1.040 (*)    Hgb urine dipstick MODERATE (*)    Bilirubin Urine SMALL (*)    Ketones, ur 15 (*)    Protein, ur 30 (*)    All other components within normal limits  CBC WITH DIFFERENTIAL/PLATELET - Abnormal; Notable for the following:    WBC 17.7 (*)    Platelets 542 (*)    Neutro Abs 14.2 (*)    Monocytes Absolute 1.4 (*)    All other components within normal limits  BASIC METABOLIC PANEL - Abnormal; Notable for the following:    Glucose, Bld 102 (*)    All other components within normal limits  URINALYSIS, MICROSCOPIC (REFLEX) - Abnormal; Notable for the following:    Bacteria, UA RARE (*)    Squamous Epithelial / LPF 6-30 (*)    All other components within normal limits  RAPID STREP SCREEN (NOT AT [facility])  CULTURE, GROUP A STREP [facility])  PREGNANCY, URINE    EKG  EKG Interpretation None       Radiology Dg Chest 2 View  Result Date: 11/02/2016 CLINICAL DATA:  Fever, nausea and vomiting, chills, sob, body aches and h/a x 1 day, smoker//a.c. EXAM: CHEST  2 VIEW COMPARISON:  None. FINDINGS: Heart size and mediastinal contours are normal. Lungs are clear. No pleural effusion or pneumothorax seen. Elevation of the right hemidiaphragm, of uncertain chronicity. Osseous and soft tissue structures about the chest are otherwise unremarkable. IMPRESSION: No active cardiopulmonary disease. No evidence of pneumonia or pulmonary edema. Electronically Signed   By: Franki Cabot M.D.   On: 11/02/2016 21:41     Procedures Procedures (including critical care time)  Medications Ordered in ED Medications  ibuprofen (ADVIL,MOTRIN) tablet 800 mg (800 mg Oral Given 11/02/16 1841)  ondansetron (ZOFRAN) injection 4 mg (4 mg Intravenous Given 11/02/16 1959)  dexamethasone (DECADRON) tablet 10 mg (10 mg Oral Given 11/02/16 2143)     Initial Impression / Assessment and Plan / ED Course  I have  reviewed the triage vital signs and the nursing notes.  Pertinent labs & imaging results that were available during my care of the patient were reviewed by me and considered in my medical decision making (see chart for details).  Clinical Course     Likely viral process. Patient with evidence of pharyngitis without evidence of peritonsillar abscess. No evidence of acute otitis media on exam. Presentation is suggestive of meningitis. Labs grossly reassuring. UA without evidence of infection. Chest x-ray without pneumonia. Rapid strep negative.  Able to tolerate by mouth. Provided with Decadron for symptomatic relief.  The patient is safe for discharge with strict return precautions.   Final Clinical Impressions(s) / ED Diagnoses   Final diagnoses:  Influenza-like illness  Acute pharyngitis, unspecified etiology   Disposition: Discharge  Condition: Good  I have discussed the results, Dx and Tx plan with the patient who expressed understanding and agree(s) with the plan. Discharge instructions discussed at great length. The patient was given strict return precautions who verbalized understanding of the instructions. No further questions at time of discharge.    New Prescriptions   No medications on file    Follow Up: primary care provider  Schedule an appointment as soon as possible for a visit  in 5-7 days, If symptoms do not improve or  worsen      Fatima Blank, MD 11/02/16 2159

## 2016-11-02 NOTE — ED Notes (Signed)
ED Provider at bedside. 

## 2016-11-02 NOTE — ED Triage Notes (Signed)
Fever, vomiting, chills, sob and body aches and headache since last night. Tylenol 500mg  2 hours ago.

## 2016-11-04 LAB — CULTURE, GROUP A STREP (THRC)
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
## 2018-06-02 ENCOUNTER — Emergency Department (HOSPITAL_BASED_OUTPATIENT_CLINIC_OR_DEPARTMENT_OTHER)
Admission: EM | Admit: 2018-06-02 | Discharge: 2018-06-02 | Disposition: A | Discharge status: Home / Self Care | Payer: Managed Care, Other (non HMO) | Attending: Emergency Medicine | Admitting: Emergency Medicine

## 2018-06-02 ENCOUNTER — Emergency Department (HOSPITAL_BASED_OUTPATIENT_CLINIC_OR_DEPARTMENT_OTHER): Payer: Managed Care, Other (non HMO)

## 2018-06-02 ENCOUNTER — Other Ambulatory Visit: Payer: Self-pay

## 2018-06-02 ENCOUNTER — Encounter (HOSPITAL_BASED_OUTPATIENT_CLINIC_OR_DEPARTMENT_OTHER): Payer: Self-pay | Admitting: *Deleted

## 2018-06-02 DIAGNOSIS — F17210 Nicotine dependence, cigarettes, uncomplicated: Secondary | ICD-10-CM | POA: Insufficient documentation

## 2018-06-02 DIAGNOSIS — R05 Cough: Secondary | ICD-10-CM | POA: Diagnosis present

## 2018-06-02 DIAGNOSIS — J069 Acute upper respiratory infection, unspecified: Secondary | ICD-10-CM | POA: Insufficient documentation

## 2018-06-02 DIAGNOSIS — B9789 Other viral agents as the cause of diseases classified elsewhere: Secondary | ICD-10-CM

## 2018-06-02 DIAGNOSIS — R0789 Other chest pain: Secondary | ICD-10-CM | POA: Diagnosis not present

## 2018-06-02 DIAGNOSIS — R0602 Shortness of breath: Secondary | ICD-10-CM | POA: Diagnosis not present

## 2018-06-02 LAB — CBC WITH DIFFERENTIAL/PLATELET
BASOS ABS: 0.1 10*3/uL (ref 0.0–0.1)
BASOS PCT: 1 %
Eosinophils Absolute: 0.2 10*3/uL (ref 0.0–0.7)
Eosinophils Relative: 2 %
HEMATOCRIT: 40.5 % (ref 36.0–46.0)
HEMOGLOBIN: 13.1 g/dL (ref 12.0–15.0)
LYMPHS PCT: 21 %
Lymphs Abs: 2.4 10*3/uL (ref 0.7–4.0)
MCH: 27.7 pg (ref 26.0–34.0)
MCHC: 32.3 g/dL (ref 30.0–36.0)
MCV: 85.6 fL (ref 78.0–100.0)
Monocytes Absolute: 1.2 10*3/uL — ABNORMAL HIGH (ref 0.1–1.0)
Monocytes Relative: 11 %
NEUTROS ABS: 7.2 10*3/uL (ref 1.7–7.7)
Neutrophils Relative %: 65 %
Platelets: 455 10*3/uL — ABNORMAL HIGH (ref 150–400)
RBC: 4.73 MIL/uL (ref 3.87–5.11)
RDW: 14.3 % (ref 11.5–15.5)
WBC: 11.1 10*3/uL — AB (ref 4.0–10.5)

## 2018-06-02 LAB — BASIC METABOLIC PANEL
ANION GAP: 6 (ref 5–15)
BUN: 7 mg/dL (ref 6–20)
CHLORIDE: 103 mmol/L (ref 98–111)
CO2: 27 mmol/L (ref 22–32)
CREATININE: 0.63 mg/dL (ref 0.44–1.00)
Calcium: 8.7 mg/dL — ABNORMAL LOW (ref 8.9–10.3)
GFR calc non Af Amer: >60 mL/min (ref >60)
Glucose, Bld: 94 mg/dL (ref 70–99)
Potassium: 3.5 mmol/L (ref 3.5–5.1)
SODIUM: 136 mmol/L (ref 135–145)

## 2018-06-02 LAB — TROPONIN I: Troponin I: <0.03 ng/mL (ref <0.03)

## 2018-06-02 MED ORDER — ALBUTEROL SULFATE (2.5 MG/3ML) 0.083% IN NEBU
5.0000 mg | INHALATION_SOLUTION | Freq: Once | RESPIRATORY_TRACT | Status: AC
  Administered 2018-06-02: 5 mg via RESPIRATORY_TRACT
  Filled 2018-06-02: qty 6

## 2018-06-02 MED ORDER — ALBUTEROL SULFATE HFA 108 (90 BASE) MCG/ACT IN AERS
2.0000 | INHALATION_SPRAY | Freq: Once | RESPIRATORY_TRACT | Status: AC
  Administered 2018-06-02: 2 via RESPIRATORY_TRACT
  Filled 2018-06-02: qty 6.7

## 2018-06-02 MED ORDER — KETOROLAC TROMETHAMINE 30 MG/ML IJ SOLN
30.0000 mg | Freq: Once | INTRAMUSCULAR | Status: AC
  Administered 2018-06-02: 30 mg via INTRAVENOUS

## 2018-06-02 MED ORDER — SODIUM CHLORIDE 0.9 % IV BOLUS
1000.0000 mL | Freq: Once | INTRAVENOUS | Status: AC
  Administered 2018-06-02: 1000 mL via INTRAVENOUS

## 2018-06-02 MED ORDER — GUAIFENESIN 100 MG/5ML PO SOLN
10.0000 mL | Freq: Once | ORAL | Status: AC
  Administered 2018-06-02: 200 mg via ORAL
  Filled 2018-06-02: qty 10

## 2018-06-02 MED ORDER — KETOROLAC TROMETHAMINE 30 MG/ML IJ SOLN
30.0000 mg | Freq: Once | INTRAMUSCULAR | Status: DC
  Filled 2018-06-02: qty 1

## 2018-06-02 NOTE — ED Triage Notes (Signed)
Pt c/o URi symptoms x 2 days

## 2018-06-02 NOTE — Discharge Instructions (Signed)
Your symptoms are likely caused by a viral upper respiratory infection. Antibiotics are not helpful in treating viral infection, the virus should run its course in about 5-7 days. Please make sure you are drinking plenty of fluids. You can treat your symptoms supportively with tylenol/ibuprofen for fevers and pains, Zyrtec and Flonase to help with nasal congestion, and over the counter cough syrups and throat lozenges to help with cough.  Please discontinue the use of Sudafed as it will increase her blood pressure and heart rate.  You may use albuterol as needed for wheezing or shortness of breath.  If your symptoms are not improving please follow up with you Primary doctor.   If you develop persistent fevers, shortness of breath or difficulty breathing, chest pain, severe headache and neck pain, persistent nausea and vomiting or other new or concerning symptoms return to the Emergency department.

## 2018-06-02 NOTE — ED Notes (Signed)
Pt verbalizes understanding of d/c instructions and denies any further need at this time. 

## 2018-06-02 NOTE — ED Provider Notes (Signed)
[facility] EMERGENCY DEPARTMENT Provider Note   CSN: [id number] Arrival date & time: 06/02/18  1818     History   Chief Complaint Chief Complaint  Patient presents with  . URI    HPI Christina Larson is a 27 y.o. female.  Christina Larson is a 27 y.o. Female with a history of hypertension, presents to the emergency department for evaluation of cough, congestion and scratchy throat for the past 2 days.  Patient reports symptoms have been constant and worsening since onset.  Cough intermittently productive of white phlegm.  Patient reports intermittent fevers and chills. Patient reports associated chest pain and shortness of breath which is especially bad when coughing.  She denies ear pain, no abdominal pain, nausea or vomiting.  Patient has been taking large amounts of Sudafed, and last had Tylenol yesterday, these have not helped her symptoms.  Chest pain is not exertional or pleuritic in nature, localized to the right costochondral margin.  No lower extremity swelling or pain, not on any estrogen therapy, no recent long distance travel or surgery, no hemoptysis, no history of PE or DVT.     Past Medical History:  Diagnosis Date  . Medical history non-contributory     There are no active problems to display for this patient.   Past Surgical History:  Procedure Laterality Date  . NO PAST SURGERIES       OB History    Gravida  1   Para      Term      Preterm      AB      Living        SAB      TAB      Ectopic      Multiple      Live Births               Home Medications    Prior to Admission medications   Medication Sig Start Date End Date Taking? Authorizing Provider  pseudoephedrine (SUDAFED) 120 MG 12 hr tablet Take 120 mg by mouth 2 (two) times daily.   Yes [provider]  acetaminophen (TYLENOL) 500 MG tablet Take 2,000-2,500 mg by mouth 2 (two) times daily as needed for mild pain or headache.     [provider]  etonogestrel (NEXPLANON) 68 MG IMPL implant 1 each by Subdermal route once. Expires August 2018    [provider]    Family History Family History  Problem Relation Age of Onset  . Alcohol abuse Neg Hx   . Arthritis Neg Hx   . Birth defects Neg Hx   . Asthma Neg Hx   . Cancer Neg Hx   . COPD Neg Hx   . Depression Neg Hx   . Diabetes Neg Hx   . Drug abuse Neg Hx   . Early death Neg Hx   . Hearing loss Neg Hx   . Heart disease Neg Hx   . Hyperlipidemia Neg Hx   . Hypertension Neg Hx   . Kidney disease Neg Hx   . Learning disabilities Neg Hx   . Mental illness Neg Hx   . Mental retardation Neg Hx   . Miscarriages / Stillbirths Neg Hx   . Stroke Neg Hx   . Vision loss Neg Hx   . Varicose Veins Neg Hx     Social History Social History   Tobacco Use  . Smoking status: Light Tobacco Smoker  . Smokeless tobacco: Never  Used  Substance Use Topics  . Alcohol use: No  . Drug use: No     Allergies   Patient has no known allergies.   Review of Systems Review of Systems  Constitutional: Positive for chills and fever.  HENT: Positive for congestion, rhinorrhea, sinus pressure and sore throat. Negative for ear pain, trouble swallowing and voice change.   Eyes: Negative for visual disturbance.  Respiratory: Positive for cough and shortness of breath.   Cardiovascular: Positive for chest pain. Negative for palpitations and leg swelling.  Gastrointestinal: Negative for abdominal pain, diarrhea, nausea and vomiting.  Genitourinary: Negative for dysuria and frequency.  Musculoskeletal: Negative for arthralgias and myalgias.  Skin: Negative for color change and rash.  Neurological: Negative for syncope, light-headedness and headaches.     Physical Exam Updated Vital Signs BP (!) 144/117 (BP Location: Left Arm)   Pulse 82   Temp 99.5 F (37.5 C) (Oral)   Resp 20   Ht 5\' 5"  (1.651 m)   Wt (!) 144.2 kg   SpO2 97%   BMI 52.92 kg/m   Physical  Exam  Constitutional: She appears well-developed and well-nourished.  Non-toxic appearance. She does not appear ill. No distress.  HENT:  Head: Normocephalic and atraumatic.  TMs clear with good landmarks, moderate nasal mucosa edema with clear rhinorrhea, posterior oropharynx clear and moist, with some erythema, no edema or exudates, uvula midline  Eyes: Right eye exhibits no discharge. Left eye exhibits no discharge.  Neck: Normal range of motion. Neck supple.  No rigidity  Cardiovascular: Normal rate, regular rhythm, normal heart sounds and intact distal pulses.  Pulmonary/Chest: Effort normal. No stridor. No respiratory distress. She has wheezes. She has no rales. She exhibits tenderness.  Respirations equal and unlabored, patient able to speak in full sentences, lungs with a few scattered wheezes bilaterally, chest discomfort repeatedly repeat with palpation over the right costochondral margin.  Abdominal: Soft. Bowel sounds are normal. She exhibits no distension and no mass. There is no tenderness. There is no guarding.  Abdomen soft, nondistended, nontender to palpation in all quadrants without guarding or peritoneal signs  Musculoskeletal: She exhibits no edema or deformity.  Bilateral lower extremities without edema or tenderness  Neurological: She is alert. Coordination normal.  Skin: Skin is warm and dry. Capillary refill takes less than 2 seconds. She is not diaphoretic.  Psychiatric: She has a normal mood and affect. Her behavior is normal.  Nursing note and vitals reviewed.    ED Treatments / Results  Labs (all labs ordered are listed, but only abnormal results are displayed) Labs Reviewed  CBC WITH DIFFERENTIAL/PLATELET - Abnormal; Notable for the following components:      Result Value   WBC 11.1 (*)    Platelets 455 (*)    Monocytes Absolute 1.2 (*)    All other components within normal limits  BASIC METABOLIC PANEL - Abnormal; Notable for the following components:    Calcium 8.7 (*)    All other components within normal limits  TROPONIN I    EKG None  Radiology Dg Chest 2 View  Result Date: 06/02/2018 CLINICAL DATA:  27 y/o F; 2 days of chest pain with cough, congestion, and possible fever. EXAM: CHEST - 2 VIEW COMPARISON:  11/02/2016 chest radiograph FINDINGS: Stable heart size and mediastinal contours are within normal limits. Both lungs are clear. The visualized skeletal structures are unremarkable. IMPRESSION: No acute pulmonary process identified. Electronically Signed   By: Kristine Garbe M.D.   On:  06/02/2018 19:18    Procedures Procedures (including critical care time)  Medications Ordered in ED Medications  guaiFENesin (ROBITUSSIN) 100 MG/5ML solution 200 mg (200 mg Oral Given 06/02/18 1951)  albuterol (PROVENTIL) (2.5 MG/3ML) 0.083% nebulizer solution 5 mg (5 mg Nebulization Given 06/02/18 2003)  ketorolac (TORADOL) 30 MG/ML injection 30 mg (30 mg Intravenous Given 06/02/18 2002)  sodium chloride 0.9 % bolus 1,000 mL (0 mLs Intravenous Stopped 06/02/18 2128)  albuterol (PROVENTIL HFA;VENTOLIN HFA) 108 (90 Base) MCG/ACT inhaler 2 puff (2 puffs Inhalation Given 06/02/18 2138)     Initial Impression / Assessment and Plan / ED Course  I have reviewed the triage vital signs and the nursing notes.  Pertinent labs & imaging results that were available during my care of the patient were reviewed by me and considered in my medical decision making (see chart for details).  She presents with 2 days of cough, congestion and sore throat.  She reports some associated shortness of breath and chest discomfort that is most pronounced with coughing.  Initially patient tenses but vitals otherwise normal, and developed fever of 101.2 with mild tachycardia 114, patient continues to be well-appearing.  Lungs with a few scattered wheezes.  Will get basic labs, troponin, EKG and chest x-ray.  Will give fluid bolus, Toradol, Robitussin and albuterol  nebulizer.  Has been taking large doses of pseudoephedrine and I think this is likely contributing to patient's blood pressure.  Chest x-ray is clear.  Troponin negative, EKG without concerning ischemic changes.  Minimal leukocytosis of 11.1, no acute electrolyte derangements.  Symptoms have significantly improved with treatment provided, fever has resolved.  Patient is still mildly tachycardic but I feel this is likely due to albuterol.  I think symptoms are most consistent with viral upper respiratory infection, discussed appropriate symptomatic treatment, lungs cleared with albuterol, given inhaler here in the emergency department.  Counseled on discontinuing use of Sudafed.  Return precautions discussed.  Patient expresses understanding and is in agreement with this plan.  Final Clinical Impressions(s) / ED Diagnoses   Final diagnoses:  Viral URI with cough  Atypical chest pain    ED Discharge Orders    None       Janet Berlin 06/03/18 [facility] Grout    Duffy Bruce, MD 06/04/18 [phone]

## 2018-06-02 NOTE — ED Notes (Signed)
Pt c/o URI symptoms for two days, has been taking sudafed without relief and tylenol yesterday.

## 2018-11-18 ENCOUNTER — Emergency Department (HOSPITAL_BASED_OUTPATIENT_CLINIC_OR_DEPARTMENT_OTHER)
Admission: EM | Admit: 2018-11-18 | Discharge: 2018-11-18 | Disposition: A | Discharge status: Home / Self Care | Payer: Managed Care, Other (non HMO) | Attending: Emergency Medicine | Admitting: Emergency Medicine

## 2018-11-18 ENCOUNTER — Other Ambulatory Visit: Payer: Self-pay

## 2018-11-18 ENCOUNTER — Encounter (HOSPITAL_BASED_OUTPATIENT_CLINIC_OR_DEPARTMENT_OTHER): Payer: Self-pay | Admitting: *Deleted

## 2018-11-18 DIAGNOSIS — R69 Illness, unspecified: Secondary | ICD-10-CM

## 2018-11-18 DIAGNOSIS — I10 Essential (primary) hypertension: Secondary | ICD-10-CM | POA: Insufficient documentation

## 2018-11-18 DIAGNOSIS — Z79899 Other long term (current) drug therapy: Secondary | ICD-10-CM | POA: Insufficient documentation

## 2018-11-18 DIAGNOSIS — F17210 Nicotine dependence, cigarettes, uncomplicated: Secondary | ICD-10-CM | POA: Insufficient documentation

## 2018-11-18 DIAGNOSIS — R112 Nausea with vomiting, unspecified: Secondary | ICD-10-CM | POA: Insufficient documentation

## 2018-11-18 DIAGNOSIS — J111 Influenza due to unidentified influenza virus with other respiratory manifestations: Secondary | ICD-10-CM

## 2018-11-18 HISTORY — DX: Essential (primary) hypertension: I10

## 2018-11-18 LAB — CBC WITH DIFFERENTIAL/PLATELET
Abs Immature Granulocytes: 0.01 10*3/uL (ref 0.00–0.07)
Basophils Absolute: 0 10*3/uL (ref 0.0–0.1)
Basophils Relative: 0 %
Eosinophils Absolute: 0 10*3/uL (ref 0.0–0.5)
Eosinophils Relative: 0 %
HCT: 39.4 % (ref 36.0–46.0)
Hemoglobin: 12.4 g/dL (ref 12.0–15.0)
Immature Granulocytes: 0 %
Lymphocytes Relative: 14 %
Lymphs Abs: 0.6 10*3/uL — ABNORMAL LOW (ref 0.7–4.0)
MCH: 27.7 pg (ref 26.0–34.0)
MCHC: 31.5 g/dL (ref 30.0–36.0)
MCV: 88.1 fL (ref 80.0–100.0)
Monocytes Absolute: 0.7 10*3/uL (ref 0.1–1.0)
Monocytes Relative: 17 %
Neutro Abs: 2.8 10*3/uL (ref 1.7–7.7)
Neutrophils Relative %: 69 %
Platelets: 406 10*3/uL — ABNORMAL HIGH (ref 150–400)
RBC: 4.47 MIL/uL (ref 3.87–5.11)
RDW: 13.9 % (ref 11.5–15.5)
WBC: 4.1 10*3/uL (ref 4.0–10.5)
nRBC: 0 % (ref 0.0–0.2)

## 2018-11-18 LAB — URINALYSIS, MICROSCOPIC (REFLEX): WBC, UA: NONE SEEN WBC/hpf (ref 0–5)

## 2018-11-18 LAB — URINALYSIS, ROUTINE W REFLEX MICROSCOPIC
Bilirubin Urine: NEGATIVE
Glucose, UA: NEGATIVE mg/dL
Ketones, ur: NEGATIVE mg/dL
LEUKOCYTES UA: NEGATIVE
Nitrite: NEGATIVE
PROTEIN: 30 mg/dL — AB
Specific Gravity, Urine: 1.02 (ref 1.005–1.030)
pH: 6.5 (ref 5.0–8.0)

## 2018-11-18 LAB — GROUP A STREP BY PCR: Group A Strep by PCR: NOT DETECTED

## 2018-11-18 LAB — COMPREHENSIVE METABOLIC PANEL
ALT: 16 U/L (ref 0–44)
AST: 19 U/L (ref 15–41)
Albumin: 3.6 g/dL (ref 3.5–5.0)
Alkaline Phosphatase: 70 U/L (ref 38–126)
Anion gap: 5 (ref 5–15)
BUN: 5 mg/dL — ABNORMAL LOW (ref 6–20)
CO2: 24 mmol/L (ref 22–32)
Calcium: 8.5 mg/dL — ABNORMAL LOW (ref 8.9–10.3)
Chloride: 104 mmol/L (ref 98–111)
Creatinine, Ser: 0.67 mg/dL (ref 0.44–1.00)
GFR calc Af Amer: >60 mL/min (ref >60)
GFR calc non Af Amer: >60 mL/min (ref >60)
Glucose, Bld: 94 mg/dL (ref 70–99)
Potassium: 3.6 mmol/L (ref 3.5–5.1)
Sodium: 133 mmol/L — ABNORMAL LOW (ref 135–145)
Total Bilirubin: 0.4 mg/dL (ref 0.3–1.2)
Total Protein: 7.5 g/dL (ref 6.5–8.1)

## 2018-11-18 LAB — PREGNANCY, URINE: Preg Test, Ur: NEGATIVE

## 2018-11-18 LAB — LIPASE, BLOOD: Lipase: 31 U/L (ref 11–51)

## 2018-11-18 MED ORDER — ACETAMINOPHEN 325 MG PO TABS
650.0000 mg | ORAL_TABLET | Freq: Once | ORAL | Status: AC | PRN
  Administered 2018-11-18: 650 mg via ORAL
  Filled 2018-11-18: qty 2

## 2018-11-18 MED ORDER — LOSARTAN POTASSIUM 50 MG PO TABS: 50.0000 mg | ORAL_TABLET | Freq: Every day | ORAL | 0 refills | Status: DC

## 2018-11-18 MED ORDER — IBUPROFEN 400 MG PO TABS
600.0000 mg | ORAL_TABLET | Freq: Once | ORAL | Status: AC
  Administered 2018-11-18: 600 mg via ORAL
  Filled 2018-11-18: qty 1

## 2018-11-18 MED ORDER — IBUPROFEN 600 MG PO TABS: 600.0000 mg | ORAL_TABLET | Freq: Four times a day (QID) | ORAL | 0 refills | Status: DC | PRN

## 2018-11-18 MED ORDER — ONDANSETRON HCL 4 MG PO TABS: 4.0000 mg | ORAL_TABLET | Freq: Four times a day (QID) | ORAL | 0 refills | Status: AC

## 2018-11-18 MED ORDER — ONDANSETRON 4 MG PO TBDP
4.0000 mg | ORAL_TABLET | Freq: Once | ORAL | Status: AC | PRN
  Administered 2018-11-18: 4 mg via ORAL
  Filled 2018-11-18: qty 1

## 2018-11-18 MED ORDER — SODIUM CHLORIDE 0.9 % IV BOLUS
1000.0000 mL | Freq: Once | INTRAVENOUS | Status: AC
  Administered 2018-11-18: 1000 mL via INTRAVENOUS

## 2018-11-18 MED ORDER — ACETAMINOPHEN 500 MG PO TABS: 500.0000 mg | ORAL_TABLET | Freq: Four times a day (QID) | ORAL | 0 refills | Status: AC | PRN

## 2018-11-18 MED ORDER — BENZONATATE 100 MG PO CAPS: 100.0000 mg | ORAL_CAPSULE | Freq: Three times a day (TID) | ORAL | 0 refills | Status: AC

## 2018-11-18 NOTE — ED Notes (Addendum)
Pt c/o nausea and vomiting x 2 days. Also states she has cough, HA, and fever with chills. Also c/o lower abdominal pain that she states "feels like menstrual cramps".

## 2018-11-18 NOTE — ED Notes (Signed)
ED Provider at bedside. 

## 2018-11-18 NOTE — Discharge Instructions (Addendum)
Take Zofran every 6 hours as needed for vomiting.  Take Tessalon every 8 hours as needed for cough.  Alternate ibuprofen and Tylenol as prescribed over-the-counter for fever. I recommend alternating each every 3 hours and take one type every 6 hours.  Make sure to drink plenty of fluids and get plenty of rest.  Please have your blood pressure rechecked at your doctor's office next couple weeks.  Please return the emergency department you develop any new or worsening symptoms.

## 2018-11-18 NOTE — ED Triage Notes (Signed)
Pt reports fever, cough, and vomiting since yesterday

## 2018-11-18 NOTE — ED Provider Notes (Signed)
[place] EMERGENCY DEPARTMENT Provider Note   CSN: [id number] Arrival date & time: 11/18/18  1618     History   Chief Complaint Chief Complaint  Patient presents with  . Emesis    HPI Christina Larson is a 28 y.o. female with history of hypertension who presents with a 1 day history of fever, cough, sore throat, nausea, vomiting, abdominal cramping.  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  She describes her symptoms as starting very suddenly.  She reports lightheadedness.  She has vomited a total of 5 times and has not been able to tolerate any food, but is tolerating some fluids.  She denies any abdominal pain.  She denies any abnormal vaginal bleeding or discharge.  She has irregular periods.  She has not had a menstrual cycle since October.  She denies any urinary symptoms.  She has not taken any medications at home for symptoms.  HPI  Past Medical History:  Diagnosis Date  . Hypertension   . Medical history non-contributory     There are no active problems to display for this patient.   Past Surgical History:  Procedure Laterality Date  . NO PAST SURGERIES       OB History    Gravida  1   Para      Term      Preterm      AB      Living        SAB      TAB      Ectopic      Multiple      Live Births               Home Medications    Prior to Admission medications   Medication Sig Start Date End Date Taking? Authorizing Provider  acetaminophen (TYLENOL) 500 MG tablet Take 1 tablet (500 mg total) by mouth every 6 (six) hours as needed. 11/18/18   Essense Bousquet, Bea Graff, PA-C  benzonatate (TESSALON) 100 MG capsule Take 1 capsule (100 mg total) by mouth every 8 (eight) hours. 11/18/18   Annastasia Haskins, Bea Graff, PA-C  etonogestrel (NEXPLANON) 68 MG IMPL implant 1 each by Subdermal route once. Expires August 2018    [provider]  ibuprofen (ADVIL,MOTRIN) 600 MG tablet Take 1 tablet (600 mg total) by mouth every 6 (six) hours  as needed. 11/18/18   Duriel Deery, Bea Graff, PA-C  losartan (COZAAR) 50 MG tablet Take 1 tablet (50 mg total) by mouth daily. 11/18/18   Charron Coultas, Bea Graff, PA-C  ondansetron (ZOFRAN) 4 MG tablet Take 1 tablet (4 mg total) by mouth every 6 (six) hours. 11/18/18   Selenne Coggin, Bea Graff, PA-C  pseudoephedrine (SUDAFED) 120 MG 12 hr tablet Take 120 mg by mouth 2 (two) times daily.    [provider]    Family History Family History  Problem Relation Age of Onset  . Alcohol abuse Neg Hx   . Arthritis Neg Hx   . Birth defects Neg Hx   . Asthma Neg Hx   . Cancer Neg Hx   . COPD Neg Hx   . Depression Neg Hx   . Diabetes Neg Hx   . Drug abuse Neg Hx   . Early death Neg Hx   . Hearing loss Neg Hx   . Heart disease Neg Hx   . Hyperlipidemia Neg Hx   . Hypertension Neg Hx   . Kidney disease Neg Hx   . Learning disabilities  Neg Hx   . Mental illness Neg Hx   . Mental retardation Neg Hx   . Miscarriages / Stillbirths Neg Hx   . Stroke Neg Hx   . Vision loss Neg Hx   . Varicose Veins Neg Hx     Social History Social History   Tobacco Use  . Smoking status: Light Tobacco Smoker  . Smokeless tobacco: Never Used  Substance Use Topics  . Alcohol use: No  . Drug use: No     Allergies   Patient has no known allergies.   Review of Systems Review of Systems  Constitutional: Positive for appetite change, chills and fever.  HENT: Positive for sore throat. Negative for ear pain and facial swelling.   Respiratory: Positive for cough and shortness of breath (after coughing a lot).   Cardiovascular: Negative for chest pain.  Gastrointestinal: Positive for nausea and vomiting. Negative for abdominal pain (cramping), blood in stool and diarrhea.  Genitourinary: Negative for dysuria, vaginal bleeding and vaginal discharge.  Musculoskeletal: Negative for back pain.  Skin: Negative for rash and wound.  Neurological: Positive for headaches.  Psychiatric/Behavioral: The patient is not  nervous/anxious.      Physical Exam Updated Vital Signs BP 129/72   Pulse (!) 105   Temp (!) 102.4 F (39.1 C) (Oral)   Resp 18   SpO2 99%   Breastfeeding No   Physical Exam Vitals signs and nursing note reviewed.  Constitutional:      General: She is not in acute distress.    Appearance: She is well-developed. She is obese. She is not diaphoretic.  HENT:     Head: Normocephalic and atraumatic.     Right Ear: Tympanic membrane normal.     Left Ear: Tympanic membrane normal.     Mouth/Throat:     Pharynx: Posterior oropharyngeal erythema present. No oropharyngeal exudate.  Eyes:     General: No scleral icterus.       Right eye: No discharge.        Left eye: No discharge.     Conjunctiva/sclera: Conjunctivae normal.     Pupils: Pupils are equal, round, and reactive to light.  Neck:     Musculoskeletal: Normal range of motion and neck supple.     Thyroid: No thyromegaly.  Cardiovascular:     Rate and Rhythm: Normal rate and regular rhythm.     Heart sounds: Normal heart sounds. No murmur. No friction rub. No gallop.   Pulmonary:     Effort: Pulmonary effort is normal. No respiratory distress.     Breath sounds: Normal breath sounds. No stridor. No wheezing or rales.  Abdominal:     General: Bowel sounds are normal. There is no distension.     Palpations: Abdomen is soft.     Tenderness: There is no abdominal tenderness. There is no guarding or rebound.  Lymphadenopathy:     Cervical: No cervical adenopathy.  Skin:    General: Skin is warm and dry.     Coloration: Skin is not pale.     Findings: No rash.  Neurological:     Mental Status: She is alert.     Coordination: Coordination normal.      ED Treatments / Results  Labs (all labs ordered are listed, but only abnormal results are displayed) Labs Reviewed  URINALYSIS, ROUTINE W REFLEX MICROSCOPIC - Abnormal; Notable for the following components:      Result Value   Hgb urine dipstick MODERATE (*)  Protein, ur 30 (*)    All other components within normal limits  URINALYSIS, MICROSCOPIC (REFLEX) - Abnormal; Notable for the following components:   Bacteria, UA RARE (*)    All other components within normal limits  COMPREHENSIVE METABOLIC PANEL - Abnormal; Notable for the following components:   Sodium 133 (*)    BUN 5 (*)    Calcium 8.5 (*)    All other components within normal limits  CBC WITH DIFFERENTIAL/PLATELET - Abnormal; Notable for the following components:   Platelets 406 (*)    Lymphs Abs 0.6 (*)    All other components within normal limits  GROUP A STREP BY PCR  PREGNANCY, URINE  LIPASE, BLOOD    EKG None  Radiology No results found.  Procedures Procedures (including critical care time)  Medications Ordered in ED Medications  acetaminophen (TYLENOL) tablet 650 mg (650 mg Oral Given 11/18/18 1639)  ondansetron (ZOFRAN-ODT) disintegrating tablet 4 mg (4 mg Oral Given 11/18/18 1639)  sodium chloride 0.9 % bolus 1,000 mL (0 mLs Intravenous Stopped 11/18/18 1915)  ibuprofen (ADVIL,MOTRIN) tablet 600 mg (600 mg Oral Given 11/18/18 1922)     Initial Impression / Assessment and Plan / ED Course  I have reviewed the triage vital signs and the nursing notes.  Pertinent labs & imaging results that were available during my care of the patient were reviewed by me and considered in my medical decision making (see chart for details).     Patient presenting with suspected influenza.  She had sudden onset of fever, cough, nausea, vomiting.  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  Labs are unremarkable.  Her abdomen is soft and nontender.  Patient is feeling better after IV fluids, Zofran, Motrin, Tylenol in the ED.  Will discharge home with Zofran, Tessalon, ibuprofen, Tylenol.  Patient also has been off of her blood pressure medication and will refill losartan.  She is advised to have it rechecked at her PCP in few weeks.  Advised she can wait to begin taking it until she is feeling  better as she did have a decrease in blood pressure from her initial 166/96 on arrival blood pressure in the ED.  Return precautions discussed.  Patient understands and agrees with plan.  Patient vital stable throughout ED course and discharged in satisfactory condition.  Final Clinical Impressions(s) / ED Diagnoses   Final diagnoses:  Non-intractable vomiting with nausea, unspecified vomiting type  Influenza-like illness    ED Discharge Orders         Ordered    ibuprofen (ADVIL,MOTRIN) 600 MG tablet  Every 6 hours PRN     11/18/18 1951    acetaminophen (TYLENOL) 500 MG tablet  Every 6 hours PRN     11/18/18 1951    benzonatate (TESSALON) 100 MG capsule  Every 8 hours     11/18/18 1951    ondansetron (ZOFRAN) 4 MG tablet  Every 6 hours     11/18/18 1951    losartan (COZAAR) 50 MG tablet  Daily     11/18/18 [address], [place] 11/18/18 1958    Elnora Morrison, MD 11/19/18 [phone]

## 2019-01-10 IMAGING — CR DG CHEST 2V
2 series · 2 of 2 positions shown · non-contrast
Comparison: 11/02/2016 chest radiograph

CLINICAL DATA: 26 y/o F; 2 days of chest pain with cough,
congestion, and possible fever.

EXAM:
CHEST - 2 VIEW

[w chest pa *]
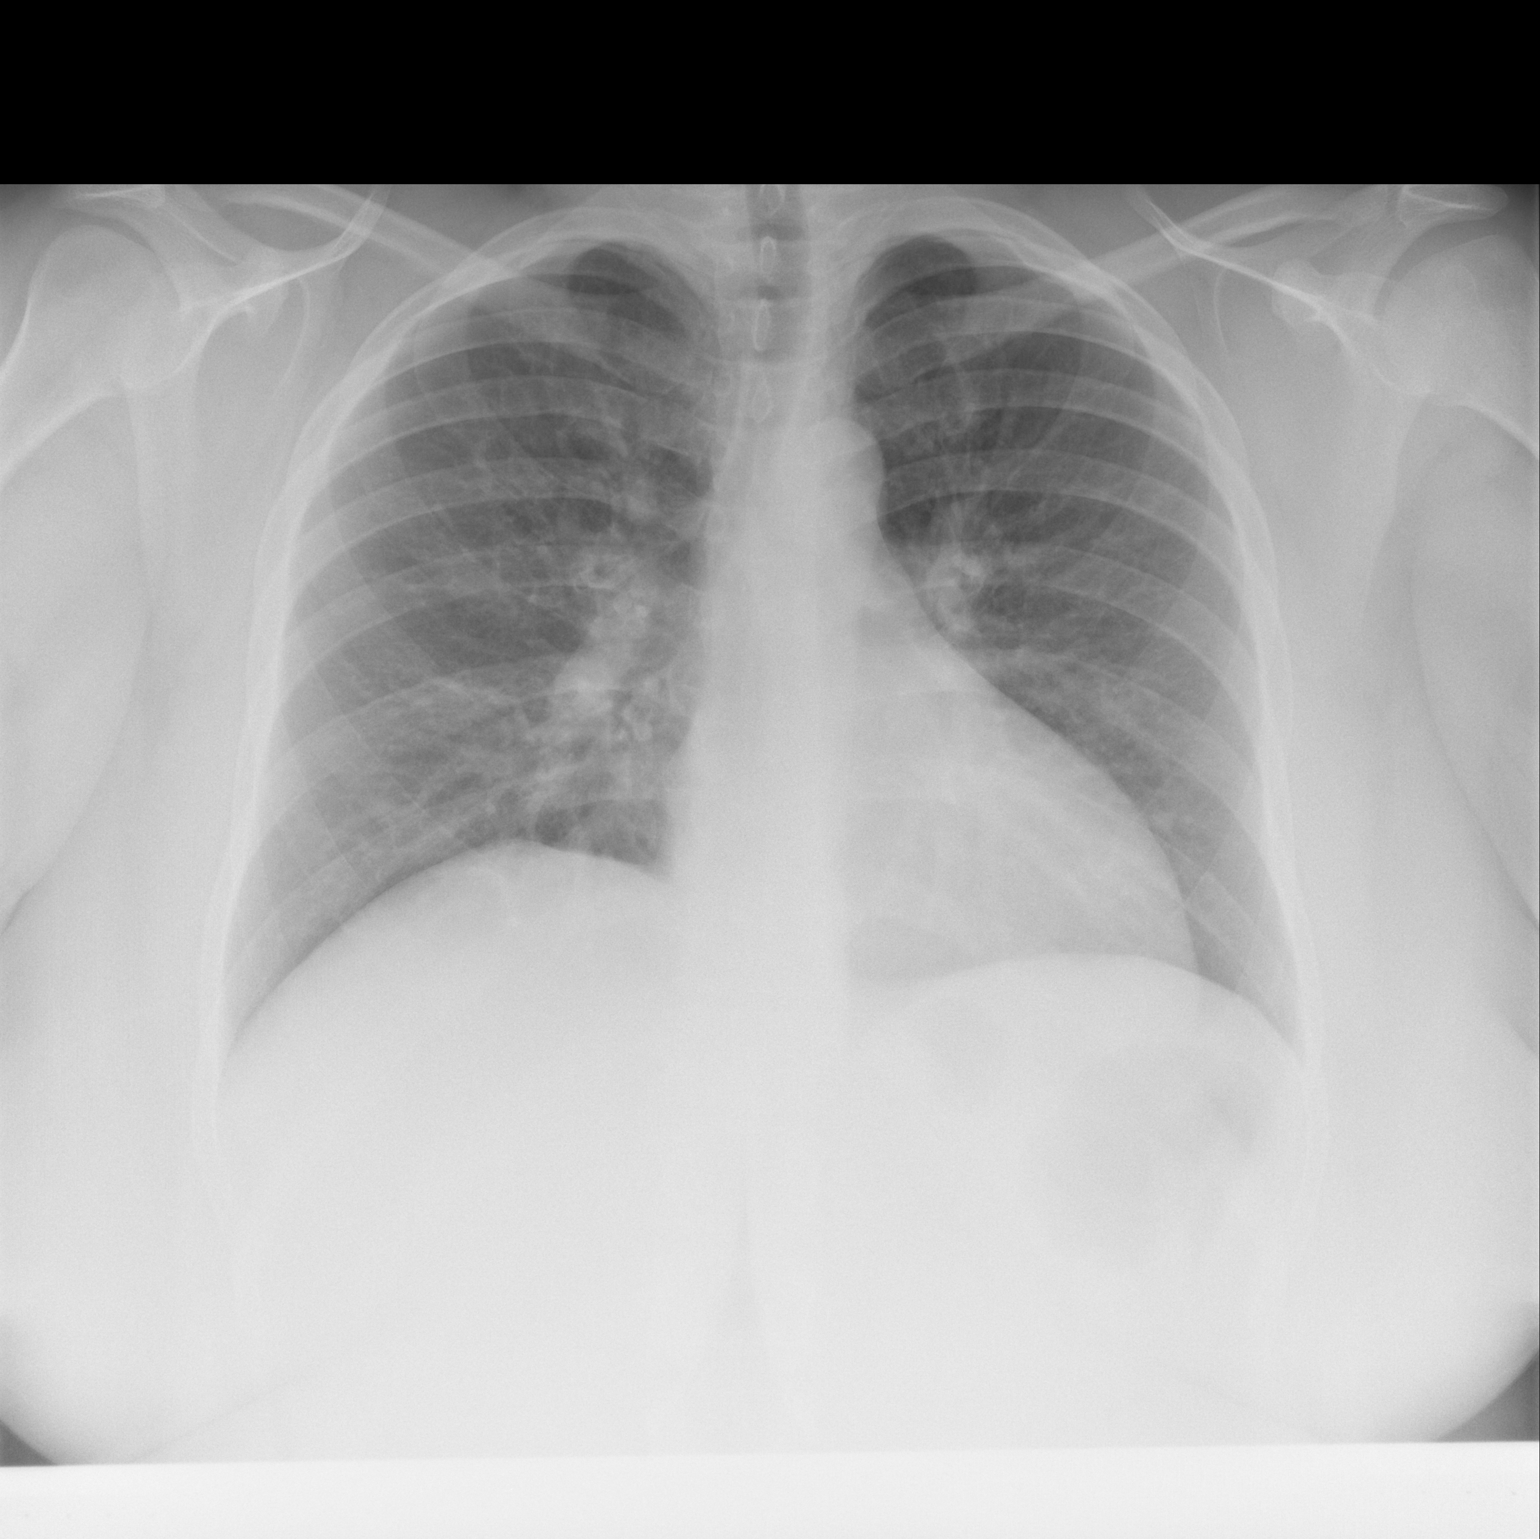

[w chest lat *]
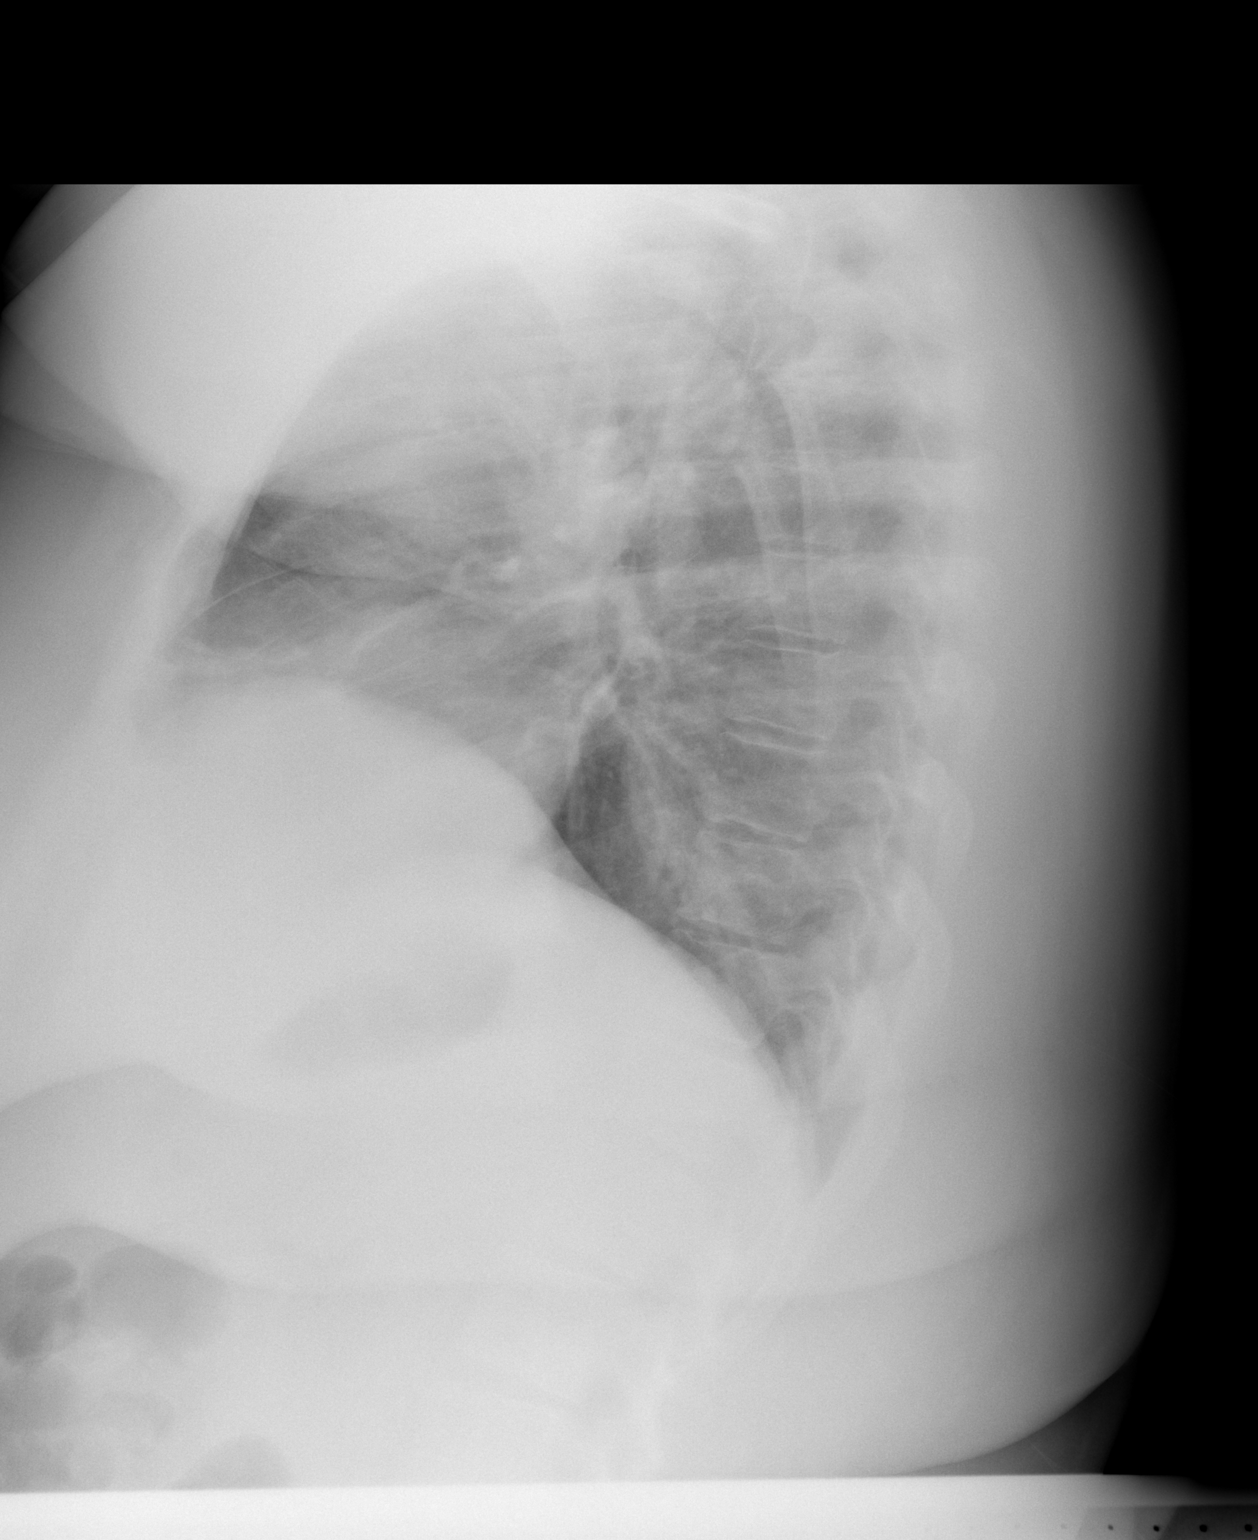

[2 of 2 positions shown; findings below may reference images not displayed]

FINDINGS: Stable heart size and mediastinal contours are within normal limits.
Both lungs are clear. The visualized skeletal structures are
unremarkable.
IMPRESSION: No acute pulmonary process identified.

By: Orgel Kerluku M.D.

## 2020-03-28 ENCOUNTER — Inpatient Hospital Stay (HOSPITAL_COMMUNITY): Payer: BLUE CROSS/BLUE SHIELD

## 2020-03-28 ENCOUNTER — Encounter (HOSPITAL_COMMUNITY): Payer: Self-pay | Admitting: *Deleted

## 2020-03-28 ENCOUNTER — Inpatient Hospital Stay (HOSPITAL_COMMUNITY)
Admission: AD | Admit: 2020-03-28 | Discharge: 2020-03-28 | Disposition: A | Discharge status: Home / Self Care | Payer: BLUE CROSS/BLUE SHIELD | Attending: Obstetrics & Gynecology | Admitting: Obstetrics & Gynecology

## 2020-03-28 DIAGNOSIS — O26891 Other specified pregnancy related conditions, first trimester: Secondary | ICD-10-CM | POA: Insufficient documentation

## 2020-03-28 DIAGNOSIS — Z791 Long term (current) use of non-steroidal anti-inflammatories (NSAID): Secondary | ICD-10-CM | POA: Diagnosis not present

## 2020-03-28 DIAGNOSIS — R109 Unspecified abdominal pain: Secondary | ICD-10-CM | POA: Insufficient documentation

## 2020-03-28 DIAGNOSIS — F17210 Nicotine dependence, cigarettes, uncomplicated: Secondary | ICD-10-CM | POA: Insufficient documentation

## 2020-03-28 DIAGNOSIS — O99331 Smoking (tobacco) complicating pregnancy, first trimester: Secondary | ICD-10-CM | POA: Insufficient documentation

## 2020-03-28 DIAGNOSIS — O209 Hemorrhage in early pregnancy, unspecified: Secondary | ICD-10-CM | POA: Diagnosis present

## 2020-03-28 DIAGNOSIS — O4691 Antepartum hemorrhage, unspecified, first trimester: Secondary | ICD-10-CM

## 2020-03-28 DIAGNOSIS — Z3A08 8 weeks gestation of pregnancy: Secondary | ICD-10-CM

## 2020-03-28 DIAGNOSIS — Z6790 Unspecified blood type, Rh positive: Secondary | ICD-10-CM

## 2020-03-28 DIAGNOSIS — O99111 Other diseases of the blood and blood-forming organs and certain disorders involving the immune mechanism complicating pregnancy, first trimester: Secondary | ICD-10-CM | POA: Insufficient documentation

## 2020-03-28 DIAGNOSIS — O3680X0 Pregnancy with inconclusive fetal viability, not applicable or unspecified: Secondary | ICD-10-CM

## 2020-03-28 DIAGNOSIS — D473 Essential (hemorrhagic) thrombocythemia: Secondary | ICD-10-CM | POA: Diagnosis not present

## 2020-03-28 DIAGNOSIS — D75839 Thrombocytosis, unspecified: Secondary | ICD-10-CM

## 2020-03-28 DIAGNOSIS — Z79899 Other long term (current) drug therapy: Secondary | ICD-10-CM | POA: Diagnosis not present

## 2020-03-28 DIAGNOSIS — O4690 Antepartum hemorrhage, unspecified, unspecified trimester: Secondary | ICD-10-CM

## 2020-03-28 DIAGNOSIS — O10911 Unspecified pre-existing hypertension complicating pregnancy, first trimester: Secondary | ICD-10-CM | POA: Insufficient documentation

## 2020-03-28 LAB — URINALYSIS, ROUTINE W REFLEX MICROSCOPIC
Bilirubin Urine: NEGATIVE
Glucose, UA: NEGATIVE mg/dL
Hgb urine dipstick: NEGATIVE
Ketones, ur: NEGATIVE mg/dL
Leukocytes,Ua: NEGATIVE
Nitrite: NEGATIVE
Protein, ur: NEGATIVE mg/dL
Specific Gravity, Urine: 1.026 (ref 1.005–1.030)
pH: 6 (ref 5.0–8.0)

## 2020-03-28 LAB — CBC
HCT: 38.8 % (ref 36.0–46.0)
Hemoglobin: 12.4 g/dL (ref 12.0–15.0)
MCH: 28.2 pg (ref 26.0–34.0)
MCHC: 32 g/dL (ref 30.0–36.0)
MCV: 88.2 fL (ref 80.0–100.0)
Platelets: 554 10*3/uL — ABNORMAL HIGH (ref 150–400)
RBC: 4.4 MIL/uL (ref 3.87–5.11)
RDW: 14.1 % (ref 11.5–15.5)
WBC: 10.8 10*3/uL — ABNORMAL HIGH (ref 4.0–10.5)
nRBC: 0 % (ref 0.0–0.2)

## 2020-03-28 LAB — WET PREP, GENITAL
Clue Cells Wet Prep HPF POC: NONE SEEN
Sperm: NONE SEEN
Trich, Wet Prep: NONE SEEN
Yeast Wet Prep HPF POC: NONE SEEN

## 2020-03-28 LAB — POCT PREGNANCY, URINE: Preg Test, Ur: POSITIVE — AB

## 2020-03-28 LAB — HCG, QUANTITATIVE, PREGNANCY: hCG, Beta Chain, Quant, S: 4064 m[IU]/mL — ABNORMAL HIGH (ref <5)

## 2020-03-28 NOTE — MAU Note (Signed)
.   Christina Larson is a 29 y.o. at [redacted]w[redacted]d here in MAU reporting: heavier vaginal bleeding this weekend with scant amount today. Lower abdominal cramping with lower back pain. + pregnancy test today LMP: 01/31/20 Onset of complaint: saturday Pain score: 7 Vitals:   03/28/20 1900  BP: 139/80  Pulse: 78  Resp: 16  Temp: 98.2 F (36.8 C)  SpO2: 99%     FHT: Lab orders placed from triage:UA/UPT

## 2020-03-28 NOTE — MAU Provider Note (Addendum)
History     CSN: [id number]  Arrival date and time: 03/28/20 1827   First Provider Initiated Contact with Patient 03/28/20 2207      Chief Complaint  Patient presents with  . Possible Pregnancy  . Vaginal Bleeding  . Abdominal Pain   Ms. Christina Larson is a 29 y.o. G2P1001 at [redacted]w[redacted]d who presents to MAU for vaginal bleeding which began last Saturday. Patient reports when she went to the bathroom she saw some dark red blood when wiping, followed by some very mild spotting, which stopped after she saw a "very tiny bit" of bleeding early this morning. Patient denies bleeding while in MAU.  Passing blood clots? no Blood soaking clothes? no Lightheaded/dizzy? no Significant pelvic pain or cramping? no Passed any tissue? no  Current pregnancy problems? Pt has not yet been seen Blood Type? O Positive Allergies? NKDA Current medications? OTC allergy, multi-vitamin Current PNC & next appt? [facility]  Pt denies vaginal discharge/odor/itching. Pt denies N/V, abdominal pain, constipation, diarrhea, or urinary problems. Pt denies fever, chills, fatigue, sweating or changes in appetite. Pt denies SOB or chest pain. Pt denies dizziness, HA, light-headedness, weakness.   OB History    Gravida  2   Para  1   Term  1   Preterm      AB      Living  1     SAB      TAB      Ectopic      Multiple      Live Births  1           Past Medical History:  Diagnosis Date  . Hypertension   . Medical history non-contributory     Past Surgical History:  Procedure Laterality Date  . NO PAST SURGERIES      Family History  Problem Relation Age of Onset  . Alcohol abuse Neg Hx   . Arthritis Neg Hx   . Birth defects Neg Hx   . Asthma Neg Hx   . Cancer Neg Hx   . COPD Neg Hx   . Depression Neg Hx   . Diabetes Neg Hx   . Drug abuse Neg Hx   . Early death Neg Hx   . Hearing loss Neg Hx   . Heart disease Neg Hx   . Hyperlipidemia Neg Hx   . Hypertension Neg Hx   .  Kidney disease Neg Hx   . Learning disabilities Neg Hx   . Mental illness Neg Hx   . Mental retardation Neg Hx   . Miscarriages / Stillbirths Neg Hx   . Stroke Neg Hx   . Vision loss Neg Hx   . Varicose Veins Neg Hx     Social History   Tobacco Use  . Smoking status: Light Tobacco Smoker  . Smokeless tobacco: Never Used  Vaping Use  . Vaping Use: Never used  Substance Use Topics  . Alcohol use: No  . Drug use: No    Allergies: No Known Allergies  Medications Prior to Admission  Medication Sig Dispense Refill Last Dose  . acetaminophen (TYLENOL) 500 MG tablet Take 1 tablet (500 mg total) by mouth every 6 (six) hours as needed. 30 tablet 0 Past Week at Unknown time  . ibuprofen (ADVIL,MOTRIN) 600 MG tablet Take 1 tablet (600 mg total) by mouth every 6 (six) hours as needed. 30 tablet 0 Past Month at Unknown time  . benzonatate (TESSALON) 100 MG capsule Take 1 capsule (100  mg total) by mouth every 8 (eight) hours. 21 capsule 0 More than a month at Unknown time  . etonogestrel (NEXPLANON) 68 MG IMPL implant 1 each by Subdermal route once. Expires August 2018   More than a month at Unknown time  . losartan (COZAAR) 50 MG tablet Take 1 tablet (50 mg total) by mouth daily. 30 tablet 0 More than a month at Unknown time  . ondansetron (ZOFRAN) 4 MG tablet Take 1 tablet (4 mg total) by mouth every 6 (six) hours. 12 tablet 0 More than a month at Unknown time  . pseudoephedrine (SUDAFED) 120 MG 12 hr tablet Take 120 mg by mouth 2 (two) times daily.   More than a month at Unknown time    Review of Systems  Constitutional: Negative for chills, diaphoresis, fatigue and fever.  Eyes: Negative for visual disturbance.  Respiratory: Negative for shortness of breath.   Cardiovascular: Negative for chest pain.  Gastrointestinal: Negative for abdominal pain, constipation, diarrhea, nausea and vomiting.  Genitourinary: Positive for vaginal bleeding. Negative for dysuria, flank pain, frequency,  pelvic pain, urgency and vaginal discharge.  Neurological: Negative for dizziness, weakness, light-headedness and headaches.   Physical Exam   Blood pressure 139/80, pulse 78, temperature 98.2 F (36.8 C), resp. rate 16, height 5\' 5"  (1.651 m), weight 131.5 kg, last menstrual period 01/31/2020, SpO2 99 %.  Patient Vitals for the past 24 hrs:  BP Temp Pulse Resp SpO2 Height Weight  03/28/20 1900 139/80 98.2 F (36.8 C) 78 16 99 % 5\' 5"  (1.651 m) 131.5 kg   Physical Exam  Constitutional: She is oriented to person, place, and time. She appears well-developed. No distress.  HENT:  Head: Normocephalic and atraumatic.  Respiratory: Effort normal.  GI: Soft.  Neurological: She is alert and oriented to person, place, and time.  Skin: Skin is warm and dry. She is not diaphoretic.  Psychiatric: Her behavior is normal. Mood, judgment and thought content normal.   Results for orders placed or performed during the hospital encounter of 03/28/20 (from the past 24 hour(s))  Urinalysis, Routine w reflex microscopic     Status: None   Collection Time: 03/28/20  6:50 PM  Result Value Ref Range   Color, Urine YELLOW YELLOW   APPearance CLEAR CLEAR   Specific Gravity, Urine 1.026 1.005 - 1.030   pH 6.0 5.0 - 8.0   Glucose, UA NEGATIVE NEGATIVE mg/dL   Hgb urine dipstick NEGATIVE NEGATIVE   Bilirubin Urine NEGATIVE NEGATIVE   Ketones, ur NEGATIVE NEGATIVE mg/dL   Protein, ur NEGATIVE NEGATIVE mg/dL   Nitrite NEGATIVE NEGATIVE   Leukocytes,Ua NEGATIVE NEGATIVE  Pregnancy, urine POC     Status: Abnormal   Collection Time: 03/28/20  6:53 PM  Result Value Ref Range   Preg Test, Ur POSITIVE (A) NEGATIVE  CBC     Status: Abnormal   Collection Time: 03/28/20  8:09 PM  Result Value Ref Range   WBC 10.8 (H) 4.0 - 10.5 K/uL   RBC 4.40 3.87 - 5.11 MIL/uL   Hemoglobin 12.4 12.0 - 15.0 g/dL   HCT 38.8 36 - 46 %   MCV 88.2 80.0 - 100.0 fL   MCH 28.2 26.0 - 34.0 pg   MCHC 32.0 30.0 - 36.0 g/dL   RDW  14.1 11.5 - 15.5 %   Platelets 554 (H) 150 - 400 K/uL   nRBC 0.0 0.0 - 0.2 %  Comprehensive metabolic panel     Status: Abnormal   Collection Time:  03/28/20  8:09 PM  Result Value Ref Range   Sodium 135 135 - 145 mmol/L   Potassium 4.1 3.5 - 5.1 mmol/L   Chloride 109 98 - 111 mmol/L   CO2 24 22 - 32 mmol/L   Glucose, Bld 106 (H) 70 - 99 mg/dL   BUN 7 6 - 20 mg/dL   Creatinine, Ser 0.65 0.44 - 1.00 mg/dL   Calcium 8.6 (L) 8.9 - 10.3 mg/dL   Total Protein 7.3 6.5 - 8.1 g/dL   Albumin 3.6 3.5 - 5.0 g/dL   AST 16 15 - 41 U/L   ALT 14 0 - 44 U/L   Alkaline Phosphatase 61 38 - 126 U/L   Total Bilirubin 0.5 0.3 - 1.2 mg/dL   GFR calc non Af Amer >60 >60 mL/min   GFR calc Af Amer >60 >60 mL/min   Anion gap 2 (L) 5 - 15  hCG, quantitative, pregnancy     Status: Abnormal   Collection Time: 03/28/20  8:09 PM  Result Value Ref Range   hCG, Beta Chain, Quant, S 4,064 (H) <5 mIU/mL  Wet prep, genital     Status: Abnormal   Collection Time: 03/28/20  9:04 PM  Result Value Ref Range   Yeast Wet Prep HPF POC NONE SEEN NONE SEEN   Trich, Wet Prep NONE SEEN NONE SEEN   Clue Cells Wet Prep HPF POC NONE SEEN NONE SEEN   WBC, Wet Prep HPF POC MODERATE (A) NONE SEEN   Sperm NONE SEEN    US OB LESS THAN 14 WEEKS WITH OB TRANSVAGINAL  Result Date: 03/28/2020 CLINICAL DATA:  Vaginal bleeding EXAM: OBSTETRIC <14 WK [place] AND TRANSVAGINAL OB US TECHNIQUE: Both transabdominal and transvaginal ultrasound examinations were performed for complete evaluation of the gestation as well as the maternal uterus, adnexal regions, and pelvic cul-de-sac. Transvaginal technique was performed to assess early pregnancy. COMPARISON:  None. FINDINGS: Intrauterine gestational sac: Present Yolk sac:  Absent Embryo:  Absent MSD: 7.9 mm   5 w   3 d Subchorionic hemorrhage:  None visualized. Maternal uterus/adnexae: Ovaries appear within normal limits. No subchorionic hemorrhage is noted. IMPRESSION: Probable early intrauterine  gestational sac, but no yolk sac, fetal pole, or cardiac activity yet visualized. Recommend follow-up quantitative B-HCG levels and follow-up US in 14 days to assess viability. This recommendation follows SRU consensus guidelines: Diagnostic Criteria for Nonviable Pregnancy Early in the First Trimester. [employer] Med 2013; 867:6195-09. Electronically Signed   By: Inez Catalina M.D.   On: 03/28/2020 21:37    MAU Course  Procedures  MDM -r/o ectopic -UA: WNL -CBC: platelets 554 -CMP: GLU 106, Calcium 8.6, Anion Gap 2 -[place]: PUL, +GS, [redacted]w[redacted]d, no yolk sac or fetal pole -hCG: 4, 064 -ABO: O Positive -WetPrep: WNL -GC/CT collected -consulted with Dr. Elonda Husky, pt OK to be discharged home with hCG in 72 hours -pt discharged to home in stable condition  Orders Placed This Encounter  Procedures  . Wet prep, genital    Standing Status:   Standing    Number of Occurrences:   1  . US OB LESS THAN 14 WEEKS WITH OB TRANSVAGINAL    Standing Status:   Standing    Number of Occurrences:   1    Order Specific Question:   Symptom/Reason for Exam    Answer:   Vaginal bleeding in pregnancy [[id number]]  . Urinalysis, Routine w reflex microscopic    Standing Status:   Standing    Number  of Occurrences:   1  . CBC    Standing Status:   Standing    Number of Occurrences:   1  . Comprehensive metabolic panel    Standing Status:   Standing    Number of Occurrences:   1  . hCG, quantitative, pregnancy    Standing Status:   Standing    Number of Occurrences:   1  . Pregnancy, urine POC    Standing Status:   Standing    Number of Occurrences:   1  . Discharge patient    Order Specific Question:   Discharge disposition    Answer:   01-Home or Self Care [1]    Order Specific Question:   Discharge patient date    Answer:   03/28/2020    Assessment and Plan   1. Pregnancy of unknown anatomic location   2. Vaginal bleeding in pregnancy   3. Blood type, Rh positive   4. Thrombocytosis (HCC)     Allergies as  of 03/28/2020   No Known Allergies     Medication List    STOP taking these medications   ibuprofen 600 MG tablet Commonly known as: ADVIL   Nexplanon 68 MG Impl implant Generic drug: etonogestrel     TAKE these medications   acetaminophen 500 MG tablet Commonly known as: TYLENOL Take 1 tablet (500 mg total) by mouth every 6 (six) hours as needed.   benzonatate 100 MG capsule Commonly known as: TESSALON Take 1 capsule (100 mg total) by mouth every 8 (eight) hours.   losartan 50 MG tablet Commonly known as: Cozaar Take 1 tablet (50 mg total) by mouth daily.   ondansetron 4 MG tablet Commonly known as: ZOFRAN Take 1 tablet (4 mg total) by mouth every 6 (six) hours.   pseudoephedrine 120 MG 12 hr tablet Commonly known as: SUDAFED Take 120 mg by mouth 2 (two) times daily.       -will call with culture results, if positive -discussed ectopic vs. SAB vs. early pregnancy -f/u hCG scheduled for Monday 03/31/2020 130PM @[facility]  -strict ectopic precautions given -return MAU precautions given -pt discharged to home in stable condition  Elmyra Ricks E Tyrhonda Georgiades 03/28/2020, 10:29 PM

## 2020-03-28 NOTE — Discharge Instructions (Signed)
Safe Medications in Pregnancy    Acne: Benzoyl Peroxide Salicylic Acid  Backache/Headache: Tylenol: 2 regular strength every 4 hours OR              2 Extra strength every 6 hours  Colds/Coughs/Allergies: Benadryl (alcohol free) 25 mg every 6 hours as needed Breath right strips Claritin Cepacol throat lozenges Chloraseptic throat spray Cold-Eeze- up to three times per day Cough drops, alcohol free Flonase (by prescription only) Guaifenesin Mucinex Robitussin DM (plain only, alcohol free) Saline nasal spray/drops Sudafed (pseudoephedrine) & Actifed ** use only after [redacted] weeks gestation and if you do not have high blood pressure Tylenol Vicks Vaporub Zinc lozenges Zyrtec   Constipation: Colace Ducolax suppositories Fleet enema Glycerin suppositories Metamucil Milk of magnesia Miralax Senokot Smooth move tea  Diarrhea: Kaopectate Imodium A-D  *NO pepto Bismol  Hemorrhoids: Anusol Anusol HC Preparation H Tucks  Indigestion: Tums Maalox Mylanta Zantac  Pepcid  Insomnia: Benadryl (alcohol free) 25mg  every 6 hours as needed Tylenol PM Unisom, no Gelcaps  Leg Cramps: Tums MagGel  Nausea/Vomiting:  Bonine Dramamine Emetrol Ginger extract Sea bands Meclizine  Nausea medication to take during pregnancy:  Unisom (doxylamine succinate 25 mg tablets) Take one tablet daily at bedtime. If symptoms are not adequately controlled, the dose can be increased to a maximum recommended dose of two tablets daily (1/2 tablet in the morning, 1/2 tablet mid-afternoon and one at bedtime). Vitamin B6 100mg  tablets. Take one tablet twice a day (up to 200 mg per day).  Skin Rashes: Aveeno products Benadryl cream or 25mg  every 6 hours as needed Calamine Lotion 1% cortisone cream  Yeast infection: Gyne-lotrimin 7 Monistat 7   **If taking multiple medications, please check labels to avoid duplicating the same active ingredients **take  medication as directed on the label ** Do not exceed 4000 mg of tylenol in 24 hours **Do not take medications that contain aspirin or ibuprofen          [facility]     Phone: [phone]  [facility] [place]  Phone: [place] [facility] [employer] [facility] [place]  Phone: [place] [facility] [place]                           Phone: [place] [facility] [place] [facility]          Phone: [phone]  [facility]    Phone: [phone]   [facility] [place])    Phone: [facility]    Phone: [phone]  [facility]    Phone: [phone]  [place]    Phone: [phone]  [facility]  Phone: [place]               Phone: [phone]  [facility]   Phone: [phone]  [facility]    Phone: [phone]                        Ectopic Pregnancy  An ectopic pregnancy is when the fertilized egg attaches (implants) outside the uterus. Most ectopic pregnancies occur in one of the tubes where eggs travel from the ovary to the uterus (fallopian tubes), but the implanting can occur in other locations. In rare  cases, ectopic pregnancies occur on the ovary, intestine, pelvis, abdomen, or cervix. In an ectopic pregnancy, the fertilized egg does not have the ability to develop into a normal, healthy baby. A ruptured ectopic pregnancy is one in which tearing or bursting of a fallopian tube causes internal bleeding. Often, there is intense lower abdominal pain, and vaginal bleeding sometimes occurs. Having an ectopic pregnancy can be life-threatening. If this dangerous condition is not treated, it can lead  to blood loss, shock, or even death. What are the causes? The most common cause of this condition is damage to one of the fallopian tubes. A fallopian tube may be narrowed or blocked, and that keeps the fertilized egg from reaching the uterus. What increases the risk? This condition is more likely to develop in women of childbearing age who have different levels of risk. The levels of risk can be divided into three categories. High risk  You have gone through infertility treatment.  You have had an ectopic pregnancy before.  You have had surgery on the fallopian tubes, or another surgical procedure, such as an abortion.  You have had surgery to have the fallopian tubes tied (tubal ligation).  You have problems or diseases of the fallopian tubes.  You have been exposed to diethylstilbestrol (DES). This medicine was used until 1971, and it had effects on babies whose mothers took the medicine.  You become pregnant while using an IUD (intrauterine device) for birth control. Moderate risk  You have a history of infertility.  You have had an STI (sexually transmitted infection).  You have a history of pelvic inflammatory disease (PID).  You have scarring from endometriosis.  You have multiple sexual partners.  You smoke. Low risk  You have had pelvic surgery.  You use vaginal douches.  You became sexually active before age 50. What are the signs or symptoms? Common symptoms of this condition include normal pregnancy symptoms, such as missing a period, nausea, tiredness, abdominal pain, breast tenderness, and bleeding. However, ectopic pregnancy will have additional symptoms, such as:  Pain with intercourse.  Irregular vaginal bleeding or spotting.  Cramping or pain on one side or in the lower abdomen.  Fast heartbeat, low blood pressure, and sweating.  Passing out while having a bowel movement. Symptoms of a ruptured ectopic pregnancy and internal bleeding may  include:  Sudden, severe pain in the abdomen and pelvis.  Dizziness, weakness, light-headedness, or fainting.  Pain in the shoulder or neck area. How is this diagnosed? This condition is diagnosed by:  A pelvic exam to locate pain or a mass in the abdomen.  A pregnancy test. This blood test checks for the presence as well as the specific level of pregnancy hormone in the bloodstream.  Ultrasound. This is performed if a pregnancy test is positive. In this test, a probe is inserted into the vagina. The probe will detect a fetus, possibly in a location other than the uterus.  Taking a sample of uterus tissue (dilation and curettage, or D&C).  Surgery to perform a visual exam of the inside of the abdomen using a thin, lighted tube that has a tiny camera on the end (laparoscope).  Culdocentesis. This procedure involves inserting a needle at the top of the vagina, behind the uterus. If blood is present in this area, it may indicate that a fallopian tube is torn. How is this treated? This condition is treated with medicine or surgery. Medicine  An injection of a medicine (methotrexate) may be given to  cause the pregnancy tissue to be absorbed. This medicine may save your fallopian tube. It may be given if: ? The diagnosis is made early, with no signs of active bleeding. ? The fallopian tube has not ruptured. ? You are considered to be a good candidate for the medicine. Usually, pregnancy hormone blood levels are checked after methotrexate treatment. This is to be sure that the medicine is effective. It may take 4-6 weeks for the pregnancy to be absorbed. Most pregnancies will be absorbed by 3 weeks. Surgery  A laparoscope may be used to remove the pregnancy tissue.  If severe internal bleeding occurs, a larger cut (incision) may be made in the lower abdomen (laparotomy) to remove the fetus and placenta. This is done to stop the bleeding.  Part or all of the fallopian tube may be removed  (salpingectomy) along with the fetus and placenta. The fallopian tube may also be repaired during the surgery.  In very rare circumstances, removal of the uterus (hysterectomy) may be required.  After surgery, pregnancy hormone testing may be done to be sure that there is no pregnancy tissue left. Whether your treatment is medicine or surgery, you may receive a Rho (D) immune globulin shot to prevent problems with any future pregnancy. This shot may be given if:  You are Rh-negative and the baby's father is Rh-positive.  You are Rh-negative and you do not know the Rh type of the baby's father. Follow these instructions at home:  Rest and limit your activity after the procedure for as long as told by your health care provider.  Until your health care provider says that it is safe: ? Do not lift anything that is heavier than 10 lb (4.5 kg), or the limit that your health care provider tells you. ? Avoid physical exercise and any movement that requires effort (is strenuous).  To help prevent constipation: ? Eat a healthy diet that includes fruits, vegetables, and whole grains. ? Drink 6-8 glasses of water per day. Get help right away if:  You develop worsening pain that is not relieved by medicine.  You have: ? A fever or chills. ? Vaginal bleeding. ? Redness and swelling at the incision site. ? Nausea and vomiting.  You feel dizzy or weak.  You feel light-headed or you faint. This information is not intended to replace advice given to you by your health care provider. Make sure you discuss any questions you have with your health care provider. Document Revised: 09/16/2017 Document Reviewed: 05/05/2016 Elsevier Patient Education  [place] A miscarriage is the loss of an unborn baby (fetus) before the 20th week of pregnancy. Most miscarriages happen during the first 3 months of pregnancy. Sometimes, a miscarriage can happen before a woman knows  that she is pregnant. Having a miscarriage can be an emotional experience. If you have had a miscarriage, talk with your health care provider about any questions you may have about miscarrying, the grieving process, and your plans for future pregnancy. What are the causes? A miscarriage may be caused by:  Problems with the genes or chromosomes of the fetus. These problems make it impossible for the baby to develop normally. They are often the result of random errors that occur early in the development of the baby, and are not passed from parent to child (not inherited).  Infection of the cervix or uterus.  Conditions that affect hormone balance in the body.  Problems with the  cervix, such as the cervix opening and thinning before pregnancy is at term (cervical insufficiency).  Problems with the uterus. These may include: ? A uterus with an abnormal shape. ? Fibroids in the uterus. ? Congenital abnormalities. These are problems that were present at birth.  Certain medical conditions.  Smoking, drinking alcohol, or using drugs.  Injury (trauma). In many cases, the cause of a miscarriage is not known. What are the signs or symptoms? Symptoms of this condition include:  Vaginal bleeding or spotting, with or without cramps or pain.  Pain or cramping in the abdomen or lower back.  Passing fluid, tissue, or blood clots from the vagina. How is this diagnosed? This condition may be diagnosed based on:  A physical exam.  Ultrasound.  Blood tests.  Urine tests. How is this treated? Treatment for a miscarriage is sometimes not necessary if you naturally pass all the tissue that was in your uterus. If necessary, this condition may be treated with:  Dilation and curettage (D&C). This is a procedure in which the cervix is stretched open and the lining of the uterus (endometrium) is scraped. This is done only if tissue from the fetus or placenta remains in the body (incomplete  miscarriage).  Medicines, such as: ? Antibiotic medicine, to treat infection. ? Medicine to help the body pass any remaining tissue. ? Medicine to reduce (contract) the size of the uterus. These medicines may be given if you have a lot of bleeding. If you have Rh negative blood and your baby was Rh positive, you will need a shot of a medicine called Rh immunoglobulinto protect your future babies from Rh blood problems. "Rh-negative" and "Rh-positive" refer to whether or not the blood has a specific protein found on the surface of red blood cells (Rh factor). Follow these instructions at home: Medicines   Take over-the-counter and prescription medicines only as told by your health care provider.  If you were prescribed antibiotic medicine, take it as told by your health care provider. Do not stop taking the antibiotic even if you start to feel better.  Do not take NSAIDs, such as aspirin and ibuprofen, unless they are approved by your health care provider. These medicines can cause bleeding. Activity  Rest as directed. Ask your health care provider what activities are safe for you.  Have someone help with home and family responsibilities during this time. General instructions  Keep track of the number of sanitary pads you use each day and how soaked (saturated) they are. Write down this information.  Monitor the amount of tissue or blood clots that you pass from your vagina. Save any large amounts of tissue for your health care provider to examine.  Do not use tampons, douche, or have sex until your health care provider approves.  To help you and your partner with the process of grieving, talk with your health care provider or seek counseling.  When you are ready, meet with your health care provider to discuss any important steps you should take for your health. Also, discuss steps you should take to have a healthy pregnancy in the future.  Keep all follow-up visits as told by your  health care provider. This is important. Where to find more information  The American Congress of Obstetricians and Gynecologists: www.acog.org  U.S. Department of Health and [place], systems of Women's Health: [place] Contact a health care provider if:  You have a fever or chills.  You have a foul smelling vaginal discharge.  You have more bleeding instead of less. Get help right away if:  You have severe cramps or pain in your back or abdomen.  You pass blood clots or tissue from your vagina that is walnut-sized or larger.  You soak more than 1 regular sanitary pad in an hour.  You become light-headed or weak.  You pass out.  You have feelings of sadness that take over your thoughts, or you have thoughts of hurting yourself. Summary  Most miscarriages happen in the first 3 months of pregnancy. Sometimes miscarriage happens before a woman even knows that she is pregnant.  Follow your health care provider's instruction for home care. Keep all follow-up appointments.  To help you and your partner with the process of grieving, talk with your health care provider or seek counseling. This information is not intended to replace advice given to you by your health care provider. Make sure you discuss any questions you have with your health care provider. Document Revised: 01/26/2019 Document Reviewed: 11/09/2016 Elsevier Patient Education  [place] of Pregnancy The first trimester of pregnancy is from week 1 until the end of week 13 (months 1 through 3). A week after a sperm fertilizes an egg, the egg will implant on the wall of the uterus. This embryo will begin to develop into a baby. Genes from you and your partner will form the baby. The female genes will determine whether the baby will be a boy or a girl. At 6-8 weeks, the eyes and face will be formed, and the heartbeat can be seen on ultrasound. At the end of 12 weeks, all  the baby's organs will be formed. Now that you are pregnant, you will want to do everything you can to have a healthy baby. Two of the most important things are to get good prenatal care and to follow your health care provider's instructions. Prenatal care is all the medical care you receive before the baby's birth. This care will help prevent, find, and treat any problems during the pregnancy and childbirth. Body changes during your first trimester Your body goes through many changes during pregnancy. The changes vary from woman to woman.  You may gain or lose a couple of pounds at first.  You may feel sick to your stomach (nauseous) and you may throw up (vomit). If the vomiting is uncontrollable, call your health care provider.  You may tire easily.  You may develop headaches that can be relieved by medicines. All medicines should be approved by your health care provider.  You may urinate more often. Painful urination may mean you have a bladder infection.  You may develop heartburn as a result of your pregnancy.  You may develop constipation because certain hormones are causing the muscles that push stool through your intestines to slow down.  You may develop hemorrhoids or swollen veins (varicose veins).  Your breasts may begin to grow larger and become tender. Your nipples may stick out more, and the tissue that surrounds them (areola) may become darker.  Your gums may bleed and may be sensitive to brushing and flossing.  Dark spots or blotches (chloasma, mask of pregnancy) may develop on your face. This will likely fade after the baby is born.  Your menstrual periods will stop.  You may have a loss of appetite.  You may develop cravings for certain kinds of food.  You may have changes in your emotions from day to day,  such as being excited to be pregnant or being concerned that something may go wrong with the pregnancy and baby.  You may have more vivid and strange  dreams.  You may have changes in your hair. These can include thickening of your hair, rapid growth, and changes in texture. Some women also have hair loss during or after pregnancy, or hair that feels dry or thin. Your hair will most likely return to normal after your baby is born. What to expect at prenatal visits During a routine prenatal visit:  You will be weighed to make sure you and the baby are growing normally.  Your blood pressure will be taken.  Your abdomen will be measured to track your baby's growth.  The fetal heartbeat will be listened to between weeks 10 and 14 of your pregnancy.  Test results from any previous visits will be discussed. Your health care provider may ask you:  How you are feeling.  If you are feeling the baby move.  If you have had any abnormal symptoms, such as leaking fluid, bleeding, severe headaches, or abdominal cramping.  If you are using any tobacco products, including cigarettes, chewing tobacco, and electronic cigarettes.  If you have any questions. Other tests that may be performed during your first trimester include:  Blood tests to find your blood type and to check for the presence of any previous infections. The tests will also be used to check for low iron levels (anemia) and protein on red blood cells (Rh antibodies). Depending on your risk factors, or if you previously had diabetes during pregnancy, you may have tests to check for high blood sugar that affects pregnant women (gestational diabetes).  Urine tests to check for infections, diabetes, or protein in the urine.  An ultrasound to confirm the proper growth and development of the baby.  Fetal screens for spinal cord problems (spina bifida) and Down syndrome.  HIV (human immunodeficiency virus) testing. Routine prenatal testing includes screening for HIV, unless you choose not to have this test.  You may need other tests to make sure you and the baby are doing well. Follow  these instructions at home: Medicines  Follow your health care provider's instructions regarding medicine use. Specific medicines may be either safe or unsafe to take during pregnancy.  Take a prenatal vitamin that contains at least 600 micrograms (mcg) of folic acid.  If you develop constipation, try taking a stool softener if your health care provider approves. Eating and drinking   Eat a balanced diet that includes fresh fruits and vegetables, whole grains, good sources of protein such as meat, eggs, or tofu, and low-fat dairy. Your health care provider will help you determine the amount of weight gain that is right for you.  Avoid raw meat and uncooked cheese. These carry germs that can cause birth defects in the baby.  Eating four or five small meals rather than three large meals a day may help relieve nausea and vomiting. If you start to feel nauseous, eating a few soda crackers can be helpful. Drinking liquids between meals, instead of during meals, also seems to help ease nausea and vomiting.  Limit foods that are high in fat and processed sugars, such as fried and sweet foods.  To prevent constipation: ? Eat foods that are high in fiber, such as fresh fruits and vegetables, whole grains, and beans. ? Drink enough fluid to keep your urine clear or pale yellow. Activity  Exercise only as directed by your health  care provider. Most women can continue their usual exercise routine during pregnancy. Try to exercise for 30 minutes at least 5 days a week. Exercising will help you: ? Control your weight. ? Stay in shape. ? Be prepared for labor and delivery.  Experiencing pain or cramping in the lower abdomen or lower back is a good sign that you should stop exercising. Check with your health care provider before continuing with normal exercises.  Try to avoid standing for long periods of time. Move your legs often if you must stand in one place for a long time.  Avoid heavy  lifting.  Wear low-heeled shoes and practice good posture.  You may continue to have sex unless your health care provider tells you not to. Relieving pain and discomfort  Wear a good support bra to relieve breast tenderness.  Take warm sitz baths to soothe any pain or discomfort caused by hemorrhoids. Use hemorrhoid cream if your health care provider approves.  Rest with your legs elevated if you have leg cramps or low back pain.  If you develop varicose veins in your legs, wear support hose. Elevate your feet for 15 minutes, 3-4 times a day. Limit salt in your diet. Prenatal care  Schedule your prenatal visits by the twelfth week of pregnancy. They are usually scheduled monthly at first, then more often in the last 2 months before delivery.  Write down your questions. Take them to your prenatal visits.  Keep all your prenatal visits as told by your health care provider. This is important. Safety  Wear your seat belt at all times when driving.  Make a list of emergency phone numbers, including numbers for family, friends, the hospital, and police and fire departments. General instructions  Ask your health care provider for a referral to a local prenatal education class. Begin classes no later than the beginning of month 6 of your pregnancy.  Ask for help if you have counseling or nutritional needs during pregnancy. Your health care provider can offer advice or refer you to specialists for help with various needs.  Do not use hot tubs, steam rooms, or saunas.  Do not douche or use tampons or scented sanitary pads.  Do not cross your legs for long periods of time.  Avoid cat litter boxes and soil used by cats. These carry germs that can cause birth defects in the baby and possibly loss of the fetus by miscarriage or stillbirth.  Avoid all smoking, herbs, alcohol, and medicines not prescribed by your health care provider. Chemicals in these products affect the formation and  growth of the baby.  Do not use any products that contain nicotine or tobacco, such as cigarettes and e-cigarettes. If you need help quitting, ask your health care provider. You may receive counseling support and other resources to help you quit.  Schedule a dentist appointment. At home, brush your teeth with a soft toothbrush and be gentle when you floss. Contact a health care provider if:  You have dizziness.  You have mild pelvic cramps, pelvic pressure, or nagging pain in the abdominal area.  You have persistent nausea, vomiting, or diarrhea.  You have a bad smelling vaginal discharge.  You have pain when you urinate.  You notice increased swelling in your face, hands, legs, or ankles.  You are exposed to fifth disease or chickenpox.  You are exposed to [place] measles (rubella) and have never had it. Get help right away if:  You have a fever.  You are  leaking fluid from your vagina.  You have spotting or bleeding from your vagina.  You have severe abdominal cramping or pain.  You have rapid weight gain or loss.  You vomit blood or material that looks like coffee grounds.  You develop a severe headache.  You have shortness of breath.  You have any kind of trauma, such as from a fall or a car accident. Summary  The first trimester of pregnancy is from week 1 until the end of week 13 (months 1 through 3).  Your body goes through many changes during pregnancy. The changes vary from woman to woman.  You will have routine prenatal visits. During those visits, your health care provider will examine you, discuss any test results you may have, and talk with you about how you are feeling. This information is not intended to replace advice given to you by your health care provider. Make sure you discuss any questions you have with your health care provider. Document Revised: 09/16/2017 Document Reviewed: 09/15/2016 Elsevier Patient Education  2020 [employer].

## 2020-03-29 LAB — COMPREHENSIVE METABOLIC PANEL
ALT: 14 U/L (ref 0–44)
AST: 16 U/L (ref 15–41)
Albumin: 3.6 g/dL (ref 3.5–5.0)
Alkaline Phosphatase: 61 U/L (ref 38–126)
Anion gap: 2 — ABNORMAL LOW (ref 5–15)
BUN: 7 mg/dL (ref 6–20)
CO2: 24 mmol/L (ref 22–32)
Calcium: 8.6 mg/dL — ABNORMAL LOW (ref 8.9–10.3)
Chloride: 109 mmol/L (ref 98–111)
Creatinine, Ser: 0.65 mg/dL (ref 0.44–1.00)
GFR calc Af Amer: >60 mL/min (ref >60)
GFR calc non Af Amer: >60 mL/min (ref >60)
Glucose, Bld: 106 mg/dL — ABNORMAL HIGH (ref 70–99)
Potassium: 4.1 mmol/L (ref 3.5–5.1)
Sodium: 135 mmol/L (ref 135–145)
Total Bilirubin: 0.5 mg/dL (ref 0.3–1.2)
Total Protein: 7.3 g/dL (ref 6.5–8.1)

## 2020-03-31 ENCOUNTER — Ambulatory Visit: Payer: Self-pay

## 2020-03-31 ENCOUNTER — Telehealth: Payer: Self-pay | Admitting: Women's Health

## 2020-03-31 DIAGNOSIS — O98811 Other maternal infectious and parasitic diseases complicating pregnancy, first trimester: Secondary | ICD-10-CM

## 2020-03-31 LAB — GC/CHLAMYDIA PROBE AMP (~~LOC~~) NOT AT ARMC
Chlamydia: POSITIVE — AB
Comment: NEGATIVE
Comment: NORMAL
Neisseria Gonorrhea: NEGATIVE

## 2020-03-31 MED ORDER — AZITHROMYCIN 500 MG PO TABS: 1000.0000 mg | ORAL_TABLET | Freq: Once | ORAL | 0 refills | Status: AC

## 2020-03-31 NOTE — Telephone Encounter (Signed)
Patient called and identified by two identifiers. Discussed +CT. Advised all partners in the last 60 days should be treated and tested and patient should abstain from intercourse from today until 7 days after partner is treated. Discussed timing for TOC. NKDA, patient only taking PNVs at this time. Pharmacy confirmed. RX sent.  Clarisa Fling, NP  2:01 PM 03/31/2020

## 2020-05-12 ENCOUNTER — Encounter (HOSPITAL_COMMUNITY): Payer: Self-pay | Admitting: Obstetrics and Gynecology

## 2020-05-12 ENCOUNTER — Inpatient Hospital Stay (HOSPITAL_COMMUNITY)
Admission: AD | Admit: 2020-05-12 | Discharge: 2020-05-12 | Disposition: A | Discharge status: Home / Self Care | Payer: PRIVATE HEALTH INSURANCE | Attending: Obstetrics and Gynecology | Admitting: Obstetrics and Gynecology

## 2020-05-12 ENCOUNTER — Other Ambulatory Visit: Payer: Self-pay

## 2020-05-12 DIAGNOSIS — O26899 Other specified pregnancy related conditions, unspecified trimester: Secondary | ICD-10-CM

## 2020-05-12 DIAGNOSIS — B349 Viral infection, unspecified: Secondary | ICD-10-CM | POA: Diagnosis not present

## 2020-05-12 DIAGNOSIS — Z79899 Other long term (current) drug therapy: Secondary | ICD-10-CM | POA: Insufficient documentation

## 2020-05-12 DIAGNOSIS — O98511 Other viral diseases complicating pregnancy, first trimester: Secondary | ICD-10-CM | POA: Insufficient documentation

## 2020-05-12 DIAGNOSIS — O10919 Unspecified pre-existing hypertension complicating pregnancy, unspecified trimester: Secondary | ICD-10-CM

## 2020-05-12 DIAGNOSIS — Z3A11 11 weeks gestation of pregnancy: Secondary | ICD-10-CM | POA: Diagnosis not present

## 2020-05-12 DIAGNOSIS — O10911 Unspecified pre-existing hypertension complicating pregnancy, first trimester: Secondary | ICD-10-CM | POA: Diagnosis not present

## 2020-05-12 DIAGNOSIS — Z8249 Family history of ischemic heart disease and other diseases of the circulatory system: Secondary | ICD-10-CM | POA: Diagnosis not present

## 2020-05-12 DIAGNOSIS — Z87891 Personal history of nicotine dependence: Secondary | ICD-10-CM | POA: Insufficient documentation

## 2020-05-12 LAB — COMPREHENSIVE METABOLIC PANEL
ALT: 23 U/L (ref 0–44)
AST: 21 U/L (ref 15–41)
Albumin: 3.3 g/dL — ABNORMAL LOW (ref 3.5–5.0)
Alkaline Phosphatase: 54 U/L (ref 38–126)
Anion gap: 8 (ref 5–15)
BUN: <5 mg/dL — ABNORMAL LOW (ref 6–20)
CO2: 23 mmol/L (ref 22–32)
Calcium: 9 mg/dL (ref 8.9–10.3)
Chloride: 104 mmol/L (ref 98–111)
Creatinine, Ser: 0.57 mg/dL (ref 0.44–1.00)
GFR calc Af Amer: >60 mL/min (ref >60)
GFR calc non Af Amer: >60 mL/min (ref >60)
Glucose, Bld: 107 mg/dL — ABNORMAL HIGH (ref 70–99)
Potassium: 3.9 mmol/L (ref 3.5–5.1)
Sodium: 135 mmol/L (ref 135–145)
Total Bilirubin: 0.3 mg/dL (ref 0.3–1.2)
Total Protein: 7.3 g/dL (ref 6.5–8.1)

## 2020-05-12 LAB — URINALYSIS, ROUTINE W REFLEX MICROSCOPIC
Bacteria, UA: NONE SEEN
Bilirubin Urine: NEGATIVE
Glucose, UA: NEGATIVE mg/dL
Hgb urine dipstick: NEGATIVE
Ketones, ur: NEGATIVE mg/dL
Leukocytes,Ua: NEGATIVE
Nitrite: NEGATIVE
Protein, ur: 30 mg/dL — AB
Specific Gravity, Urine: 1.032 — ABNORMAL HIGH (ref 1.005–1.030)
pH: 5 (ref 5.0–8.0)

## 2020-05-12 LAB — CBC
HCT: 37.3 % (ref 36.0–46.0)
Hemoglobin: 11.8 g/dL — ABNORMAL LOW (ref 12.0–15.0)
MCH: 28 pg (ref 26.0–34.0)
MCHC: 31.6 g/dL (ref 30.0–36.0)
MCV: 88.6 fL (ref 80.0–100.0)
Platelets: 539 10*3/uL — ABNORMAL HIGH (ref 150–400)
RBC: 4.21 MIL/uL (ref 3.87–5.11)
RDW: 13.8 % (ref 11.5–15.5)
WBC: 10.4 10*3/uL (ref 4.0–10.5)
nRBC: 0 % (ref 0.0–0.2)

## 2020-05-12 LAB — PROTEIN / CREATININE RATIO, URINE
Creatinine, Urine: 393.19 mg/dL
Protein Creatinine Ratio: 0.06 mg/mg{Cre} (ref 0.00–0.15)
Total Protein, Urine: 22 mg/dL

## 2020-05-12 MED ORDER — ACETAMINOPHEN 500 MG PO TABS
1000.0000 mg | ORAL_TABLET | Freq: Once | ORAL | Status: AC
  Administered 2020-05-12: 1000 mg via ORAL
  Filled 2020-05-12: qty 2

## 2020-05-12 MED ORDER — LACTATED RINGERS IV BOLUS
1000.0000 mL | Freq: Once | INTRAVENOUS | Status: AC
  Administered 2020-05-12: 1000 mL via INTRAVENOUS

## 2020-05-12 NOTE — MAU Provider Note (Signed)
History     [id number]  Arrival date and time: 05/12/20 1637    Chief Complaint  Patient presents with   Headache   Hypertension   Abdominal Pain   Shortness of Breath   Chest Pain     HPI Sokha Craker is a 29 y.o. at [redacted]w[redacted]d by outside [place] with PMHx notable for strong family hx of HTN, who presents for multiple issues.   Has started seeing [facility] on 05/01/2020 BP was normal but high end at that time Over the weekend has had splitting headache, felt dizzy, nauseous Today decided to check BP and got multiple high readings Highest 152/103 Also started having abdominal pain, called OB and instructed to present to ED  Currently endorses headache  Currently has "a little chest pain". Feels it in mid upper chest, come and go, can not think of anything that makes it worse. Pain worse with deep breathing or coughing. Worse with palpation on that area of her chest. Reports strong family hx of HTN (mother, father, both sets of grandparents) No runny nose but does feel a little congested No cough  Currently feels abdominal pain like a period cramp which has been present all day Feels it in her hips and pelvis Denies having any vaginal bleeding Denies contractions Denies vision changes, SOB, RUQ pain, swelling in legs  No one else in family has been sick  Received COVID vaccine in March/April, Pfizer  Reports [place] when she went to [facility], told it was normal and given due date of 11/26/2020     OB History    Gravida  2   Para  1   Term  1   Preterm      AB      Living  1     SAB      TAB      Ectopic      Multiple      Live Births  1           Past Medical History:  Diagnosis Date   Hypertension    Medical history non-contributory     Past Surgical History:  Procedure Laterality Date   CESAREAN SECTION      Family History  Problem Relation Age of Onset   Alcohol abuse Neg Hx    Arthritis Neg Hx    Birth defects Neg Hx     Asthma Neg Hx    Cancer Neg Hx    COPD Neg Hx    Depression Neg Hx    Diabetes Neg Hx    Drug abuse Neg Hx    Early death Neg Hx    Hearing loss Neg Hx    Heart disease Neg Hx    Hyperlipidemia Neg Hx    Hypertension Neg Hx    Kidney disease Neg Hx    Learning disabilities Neg Hx    Mental illness Neg Hx    Mental retardation Neg Hx    Miscarriages / Stillbirths Neg Hx    Stroke Neg Hx    Vision loss Neg Hx    Varicose Veins Neg Hx     Social History   Socioeconomic History   Marital status: Single    Spouse name: Not on file   Number of children: Not on file   Years of education: Not on file   Highest education level: Not on file  Occupational History   Not on file  Tobacco Use   Smoking status: Former Smoker  Smokeless tobacco: Never Used   Tobacco comment: stopped when she found out she was pregnant  Vaping Use   Vaping Use: Never used  Substance and Sexual Activity   Alcohol use: No   Drug use: No   Sexual activity: Yes    Birth control/protection: None  Other Topics Concern   Not on file  Social History Narrative   Not on file   Social Determinants of Health   Financial Resource Strain:    Difficulty of Paying Living Expenses:   Food Insecurity:    Worried About Charity fundraiser in the Last Year:    Arboriculturist in the Last Year:   Transportation Needs:    Film/video editor (Medical):    Lack of Transportation (Non-Medical):   Physical Activity:    Days of Exercise per Week:    Minutes of Exercise per Session:   Stress:    Feeling of Stress :   Social Connections:    Frequency of Communication with Friends and Family:    Frequency of Social Gatherings with Friends and Family:    Attends Religious Services:    Active Member of Clubs or Organizations:    Attends Music therapist:    Marital Status:   Intimate Partner Violence:    Fear of Current or Ex-Partner:     Emotionally Abused:    Physically Abused:    Sexually Abused:     No Known Allergies  No current facility-administered medications on file prior to encounter.   Current Outpatient Medications on File Prior to Encounter  Medication Sig Dispense Refill   acetaminophen (TYLENOL) 500 MG tablet Take 1 tablet (500 mg total) by mouth every 6 (six) hours as needed. 30 tablet 0   Prenatal Vit-Fe Fumarate-FA (MULTIVITAMIN-PRENATAL) 27-0.8 MG TABS tablet Take 1 tablet by mouth daily at 12 noon.     benzonatate (TESSALON) 100 MG capsule Take 1 capsule (100 mg total) by mouth every 8 (eight) hours. 21 capsule 0   losartan (COZAAR) 50 MG tablet Take 1 tablet (50 mg total) by mouth daily. 30 tablet 0   ondansetron (ZOFRAN) 4 MG tablet Take 1 tablet (4 mg total) by mouth every 6 (six) hours. 12 tablet 0   pseudoephedrine (SUDAFED) 120 MG 12 hr tablet Take 120 mg by mouth 2 (two) times daily.       ROS Pertinent positives and negative per HPI, all others reviewed and negative  Physical Exam   BP (!) 132/83    Pulse 94    Temp 98.6 F (37 C) (Oral)    Resp 18    Ht 5\' 4"  (1.626 m)    Wt (!) 147.4 kg    LMP 01/31/2020    SpO2 100%    BMI 55.79 kg/m   Physical Exam Vitals reviewed.  Constitutional:      General: She is not in acute distress.    Appearance: She is well-developed. She is not diaphoretic.  Eyes:     General: No scleral icterus. Pulmonary:     Effort: Pulmonary effort is normal. No respiratory distress.  Abdominal:     General: There is no distension.     Palpations: Abdomen is soft.     Tenderness: There is no abdominal tenderness. There is no guarding or rebound.  Skin:    General: Skin is warm and dry.  Neurological:     Mental Status: She is alert.     Coordination: Coordination normal.  Cervical Exam  deferred  Bedside Ultrasound Pt informed that the ultrasound is considered a limited OB ultrasound and is not intended to be a complete ultrasound exam.   Patient also informed that the ultrasound is not being completed with the intent of assessing for fetal or placental anomalies or any pelvic abnormalities.  Explained that the purpose of todays ultrasound is to assess for  viability.  Patient acknowledges the purpose of the exam and the limitations of the study.    My interpretation: IUP with vigorous fetal activity, FHR appears normal (unable to obtain M-mode due to fetal movement)  FHT N/a  Labs Results for orders placed or performed during the hospital encounter of 05/12/20 (from the past 24 hour(s))  Urinalysis, Routine w reflex microscopic     Status: Abnormal   Collection Time: 05/12/20  5:51 PM  Result Value Ref Range   Color, Urine AMBER (A) YELLOW   APPearance CLEAR CLEAR   Specific Gravity, Urine 1.032 (H) 1.005 - 1.030   pH 5.0 5.0 - 8.0   Glucose, UA NEGATIVE NEGATIVE mg/dL   Hgb urine dipstick NEGATIVE NEGATIVE   Bilirubin Urine NEGATIVE NEGATIVE   Ketones, ur NEGATIVE NEGATIVE mg/dL   Protein, ur 30 (A) NEGATIVE mg/dL   Nitrite NEGATIVE NEGATIVE   Leukocytes,Ua NEGATIVE NEGATIVE   RBC / HPF 0-5 0 - 5 RBC/hpf   WBC, UA 0-5 0 - 5 WBC/hpf   Bacteria, UA NONE SEEN NONE SEEN   Squamous Epithelial / LPF 0-5 0 - 5   Mucus PRESENT   Protein / creatinine ratio, urine     Status: None   Collection Time: 05/12/20  5:51 PM  Result Value Ref Range   Creatinine, Urine 393.19 mg/dL   Total Protein, Urine 22 mg/dL   Protein Creatinine Ratio 0.06 0.00 - 0.15 mg/mg[Cre]  CBC     Status: Abnormal   Collection Time: 05/12/20  5:53 PM  Result Value Ref Range   WBC 10.4 4.0 - 10.5 K/uL   RBC 4.21 3.87 - 5.11 MIL/uL   Hemoglobin 11.8 (L) 12.0 - 15.0 g/dL   HCT 37.3 36 - 46 %   MCV 88.6 80.0 - 100.0 fL   MCH 28.0 26.0 - 34.0 pg   MCHC 31.6 30.0 - 36.0 g/dL   RDW 13.8 11.5 - 15.5 %   Platelets 539 (H) 150 - 400 K/uL   nRBC 0.0 0.0 - 0.2 %  Comprehensive metabolic panel     Status: Abnormal   Collection Time: 05/12/20  5:53 PM   Result Value Ref Range   Sodium 135 135 - 145 mmol/L   Potassium 3.9 3.5 - 5.1 mmol/L   Chloride 104 98 - 111 mmol/L   CO2 23 22 - 32 mmol/L   Glucose, Bld 107 (H) 70 - 99 mg/dL   BUN <5 (L) 6 - 20 mg/dL   Creatinine, Ser 0.57 0.44 - 1.00 mg/dL   Calcium 9.0 8.9 - 10.3 mg/dL   Total Protein 7.3 6.5 - 8.1 g/dL   Albumin 3.3 (L) 3.5 - 5.0 g/dL   AST 21 15 - 41 U/L   ALT 23 0 - 44 U/L   Alkaline Phosphatase 54 38 - 126 U/L   Total Bilirubin 0.3 0.3 - 1.2 mg/dL   GFR calc non Af Amer >60 >60 mL/min   GFR calc Af Amer >60 >60 mL/min   Anion gap 8 5 - 15    Imaging No results found.  MAU Course  Procedures  Lab Orders     Urinalysis, Routine w reflex microscopic     CBC     Comprehensive metabolic panel     Protein / creatinine ratio, urine Meds ordered this encounter  Medications   lactated ringers bolus 1,000 mL   acetaminophen (TYLENOL) tablet 1,000 mg   Imaging Orders  No imaging studies ordered today    MDM moderate  Assessment and Plan  #cHTN Baseline labs obtained, unremarkable. No indications for meds at present. Encouraged to cont follow up w primary OB.   #Viral illness Various symptoms most c/w viral illness. Patient worried they may be signs of Preeclampsia or other serious CV disease. Reassured her that chest discomfort pleuritic and not likely to be cardiac in etiology, remainder of symptoms not c/w BP and regardless only minor elevations of BP. Low suspicion for COVID given vaccinated. Discussed return precautions.   #FWB Limited US shows IUP w good fetal movement and normal cardiac activity.   Clarnce Flock

## 2020-05-12 NOTE — Discharge Instructions (Signed)

## 2020-05-12 NOTE — MAU Note (Signed)
Had this really bad migraine since Fri night, taking Tylenol- no relief.  BP usually around 130/80.... checked it a couple time today 153/103.  Feeling really dizzy, SOB, chest pain and abd pain.  Started early this morning (0800)

## 2020-11-05 IMAGING — US US OB < 14 WEEKS - US OB TV
1 series · 15 of 28 positions shown · non-contrast
Comparison: None.

CLINICAL DATA: Vaginal bleeding

EXAM:
OBSTETRIC <14 WK US AND TRANSVAGINAL OB US
TECHNIQUE: Both transabdominal and transvaginal ultrasound examinations were
performed for complete evaluation of the gestation as well as the
maternal uterus, adnexal regions, and pelvic cul-de-sac.
Transvaginal technique was performed to assess early pregnancy.

[Series 1: us ob < 14 weeks - us ob tv · 15 of 49 slices shown]
[im 1/49]
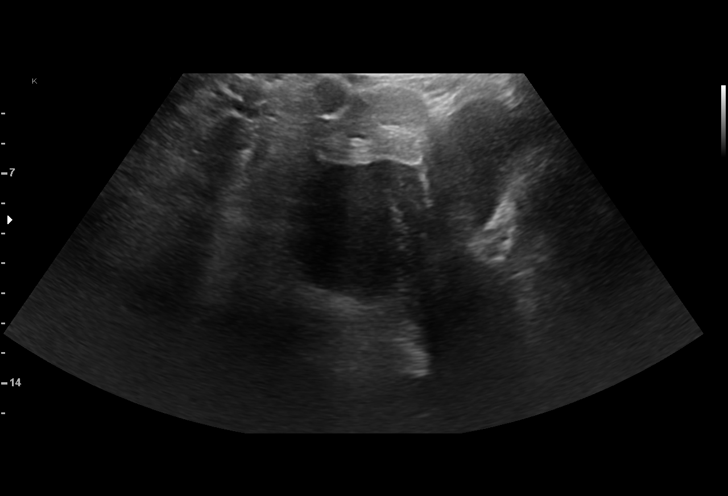
[im 4/49]
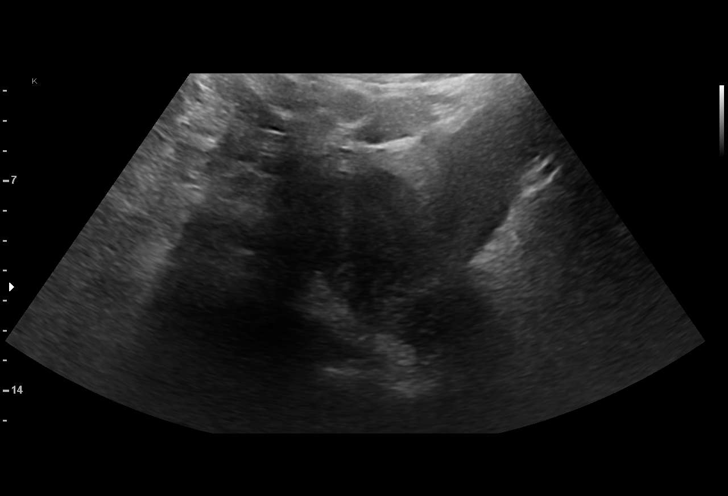
[im 8/49]
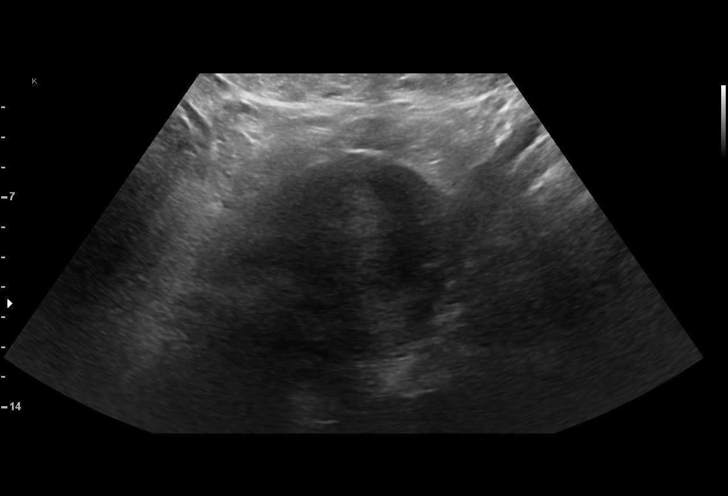
[im 11/49]
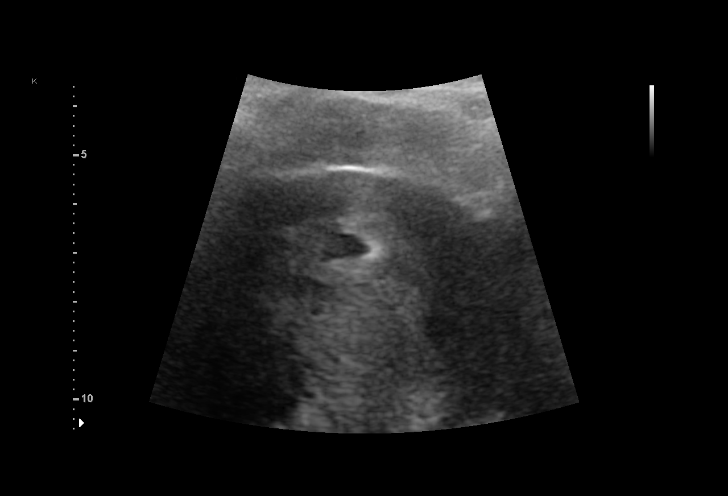
[im 15/49]
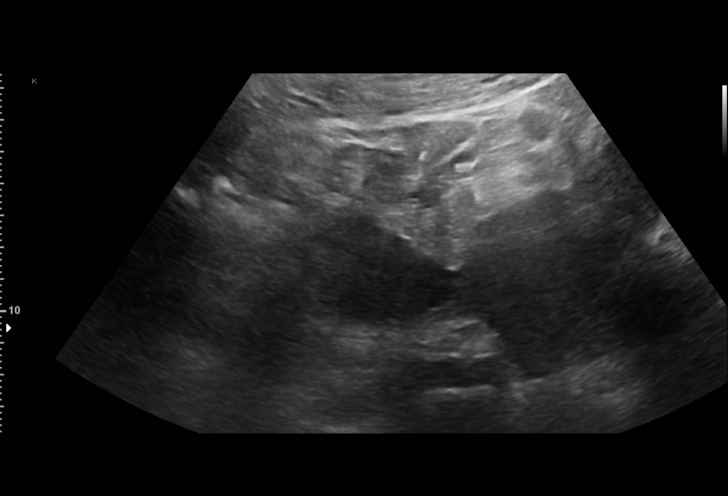
[im 18/49]
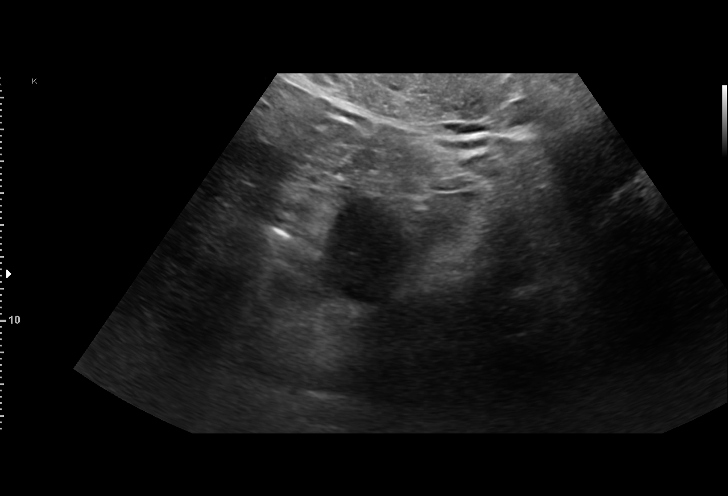
[im 22/49]
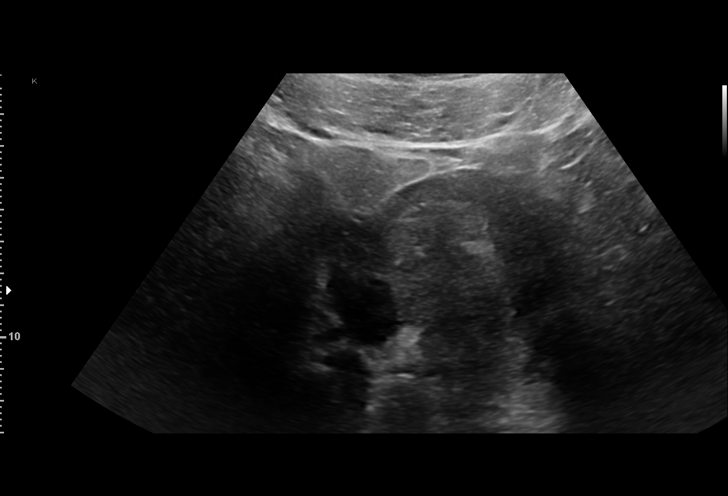
[im 25/49]
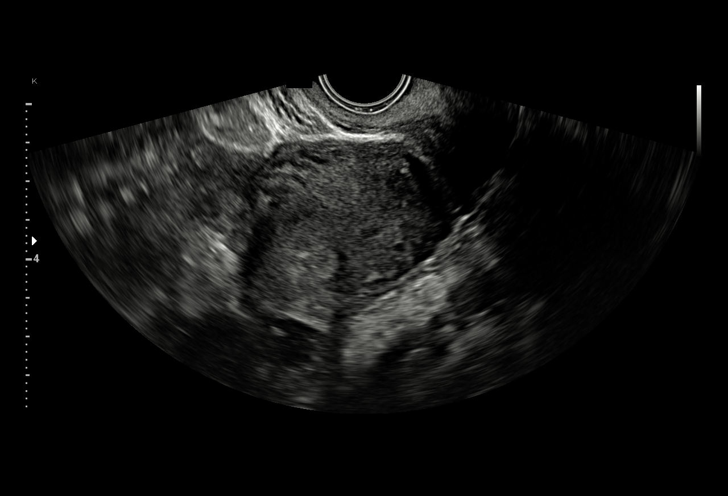
[im 27/49]
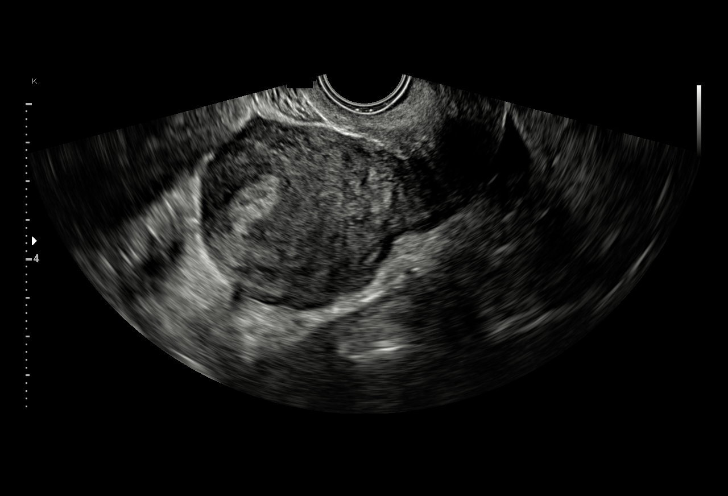
[im 31/49]
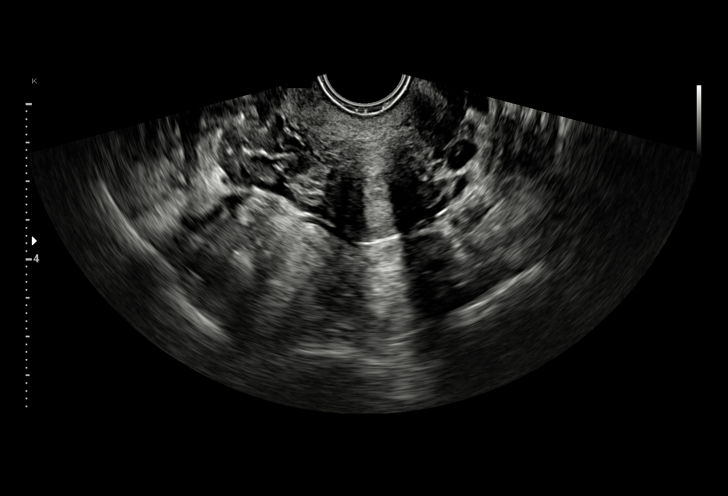
[im 34/49]
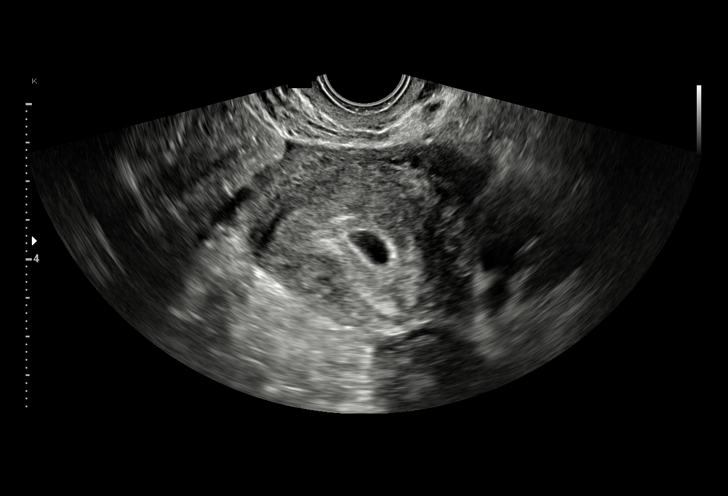
[im 38/49]
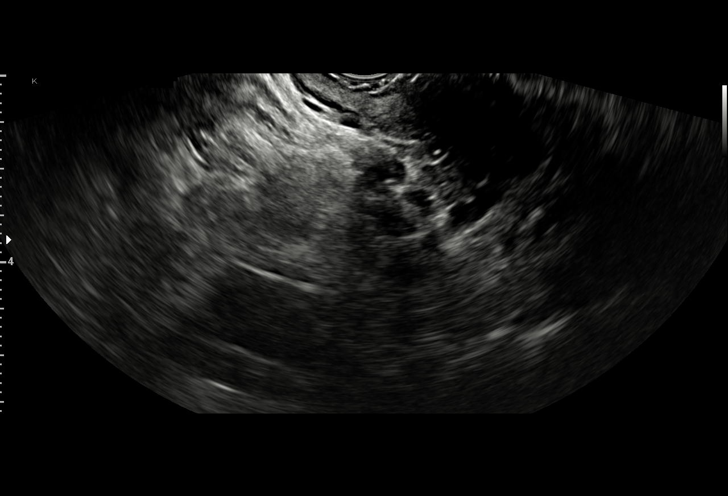
[im 41/49]
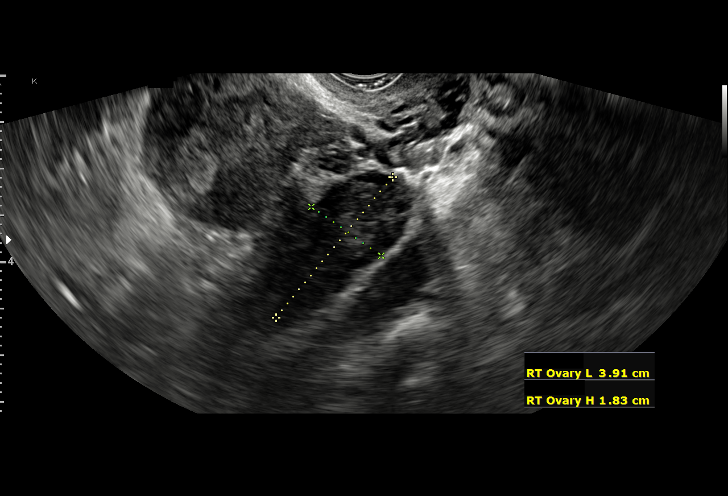
[im 45/49]
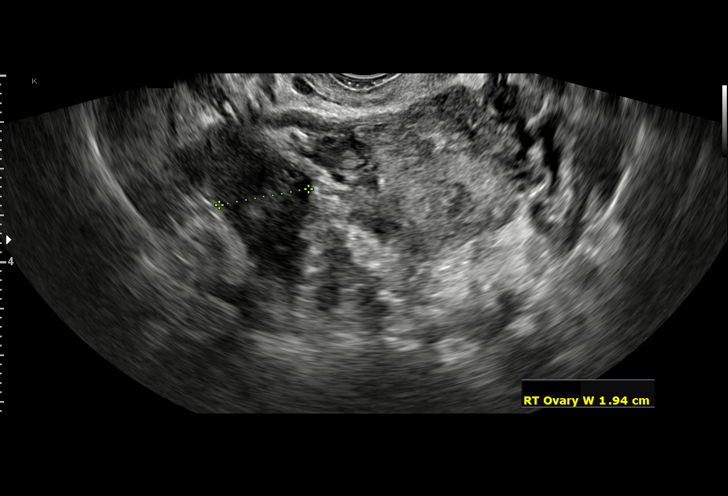
[im 49/49]
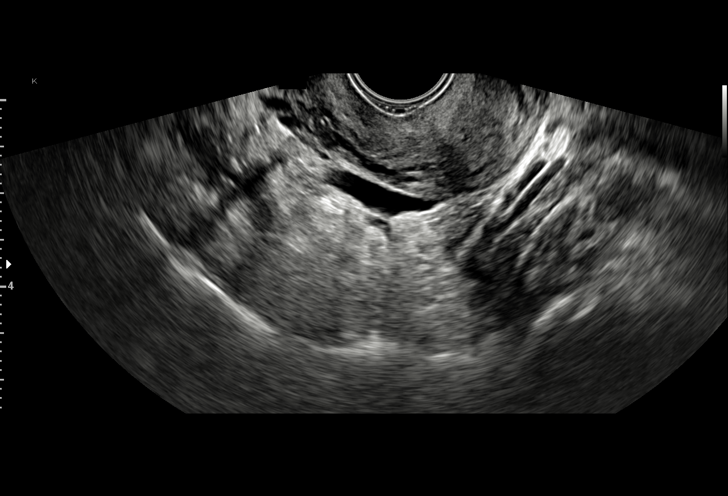

[15 of 28 positions shown; findings below may reference images not displayed]

FINDINGS: Intrauterine gestational sac: Present

Yolk sac:  Absent

Embryo:  Absent

MSD: 7.9 mm   5 w   3 d

Subchorionic hemorrhage:  None visualized.

Maternal uterus/adnexae: Ovaries appear within normal limits. No
subchorionic hemorrhage is noted.
IMPRESSION: Probable early intrauterine gestational sac, but no yolk sac, fetal
pole, or cardiac activity yet visualized. Recommend follow-up
quantitative B-HCG levels and follow-up US in 14 days to assess
viability. This recommendation follows SRU consensus guidelines:
Diagnostic Criteria for Nonviable Pregnancy Early in the First
Trimester. N Engl J Med 2722; [DATE].
